# Patient Record
Sex: Male | Born: 1990 | Race: White | Hispanic: No | Marital: Single | Smoking: Never smoker
Health system: Southern US, Community
[De-identification: ages and names within clinical notes are randomized; demographics above are authoritative.]

## PROBLEM LIST (undated history)

## (undated) ENCOUNTER — Emergency Department (HOSPITAL_COMMUNITY): Admission: EM | Payer: BLUE CROSS/BLUE SHIELD

## (undated) DIAGNOSIS — K449 Diaphragmatic hernia without obstruction or gangrene: Secondary | ICD-10-CM

## (undated) DIAGNOSIS — Z87820 Personal history of traumatic brain injury: Secondary | ICD-10-CM

## (undated) DIAGNOSIS — F909 Attention-deficit hyperactivity disorder, unspecified type: Secondary | ICD-10-CM

## (undated) DIAGNOSIS — I517 Cardiomegaly: Secondary | ICD-10-CM

## (undated) DIAGNOSIS — K209 Esophagitis, unspecified without bleeding: Secondary | ICD-10-CM

## (undated) DIAGNOSIS — K219 Gastro-esophageal reflux disease without esophagitis: Secondary | ICD-10-CM

## (undated) DIAGNOSIS — G43909 Migraine, unspecified, not intractable, without status migrainosus: Secondary | ICD-10-CM

## (undated) HISTORY — DX: Cardiomegaly: I51.7

## (undated) HISTORY — PX: CHOLECYSTECTOMY, LAPAROSCOPIC: SHX56

## (undated) HISTORY — PX: WISDOM TOOTH EXTRACTION: SHX21

## (undated) HISTORY — PX: ESOPHAGOGASTRODUODENOSCOPY ENDOSCOPY: SHX5814

## (undated) HISTORY — PX: CHOLECYSTECTOMY: SHX55

---

## 2014-07-16 ENCOUNTER — Encounter (HOSPITAL_COMMUNITY): Payer: Self-pay | Admitting: Emergency Medicine

## 2014-07-16 ENCOUNTER — Emergency Department (HOSPITAL_COMMUNITY)
Admission: EM | Admit: 2014-07-16 | Discharge: 2014-07-17 | Disposition: A | Discharge status: Home / Self Care | Payer: BC Managed Care – PPO | Attending: Emergency Medicine | Admitting: Emergency Medicine

## 2014-07-16 ENCOUNTER — Emergency Department (HOSPITAL_COMMUNITY): Payer: BC Managed Care – PPO

## 2014-07-16 DIAGNOSIS — Z8709 Personal history of other diseases of the respiratory system: Secondary | ICD-10-CM | POA: Insufficient documentation

## 2014-07-16 DIAGNOSIS — R112 Nausea with vomiting, unspecified: Secondary | ICD-10-CM | POA: Insufficient documentation

## 2014-07-16 DIAGNOSIS — Z8659 Personal history of other mental and behavioral disorders: Secondary | ICD-10-CM | POA: Insufficient documentation

## 2014-07-16 DIAGNOSIS — R1084 Generalized abdominal pain: Secondary | ICD-10-CM | POA: Diagnosis not present

## 2014-07-16 DIAGNOSIS — K219 Gastro-esophageal reflux disease without esophagitis: Secondary | ICD-10-CM | POA: Diagnosis not present

## 2014-07-16 DIAGNOSIS — R05 Cough: Secondary | ICD-10-CM | POA: Diagnosis not present

## 2014-07-16 DIAGNOSIS — R6883 Chills (without fever): Secondary | ICD-10-CM | POA: Insufficient documentation

## 2014-07-16 DIAGNOSIS — R079 Chest pain, unspecified: Secondary | ICD-10-CM | POA: Diagnosis not present

## 2014-07-16 DIAGNOSIS — R1010 Upper abdominal pain, unspecified: Secondary | ICD-10-CM

## 2014-07-16 HISTORY — DX: Attention-deficit hyperactivity disorder, unspecified type: F90.9

## 2014-07-16 HISTORY — DX: Gastro-esophageal reflux disease without esophagitis: K21.9

## 2014-07-16 LAB — CBC WITH DIFFERENTIAL/PLATELET
Basophils Absolute: 0 10*3/uL (ref 0.0–0.1)
Basophils Relative: 0 % (ref 0–1)
Eosinophils Absolute: 0.1 10*3/uL (ref 0.0–0.7)
Eosinophils Relative: 1 % (ref 0–5)
HEMATOCRIT: 43.9 % (ref 39.0–52.0)
HEMOGLOBIN: 15.3 g/dL (ref 13.0–17.0)
LYMPHS ABS: 1.7 10*3/uL (ref 0.7–4.0)
LYMPHS PCT: 25 % (ref 12–46)
MCH: 30.2 pg (ref 26.0–34.0)
MCHC: 34.9 g/dL (ref 30.0–36.0)
MCV: 86.6 fL (ref 78.0–100.0)
MONO ABS: 0.4 10*3/uL (ref 0.1–1.0)
Monocytes Relative: 7 % (ref 3–12)
Neutro Abs: 4.6 10*3/uL (ref 1.7–7.7)
Neutrophils Relative %: 67 % (ref 43–77)
Platelets: 255 10*3/uL (ref 150–400)
RBC: 5.07 MIL/uL (ref 4.22–5.81)
RDW: 13 % (ref 11.5–15.5)
WBC: 6.8 10*3/uL (ref 4.0–10.5)

## 2014-07-16 LAB — COMPREHENSIVE METABOLIC PANEL
ALT: 80 U/L — ABNORMAL HIGH (ref 0–53)
ANION GAP: 15 (ref 5–15)
AST: 45 U/L — AB (ref 0–37)
Albumin: 4.6 g/dL (ref 3.5–5.2)
Alkaline Phosphatase: 61 U/L (ref 39–117)
BUN: 9 mg/dL (ref 6–23)
CO2: 23 meq/L (ref 19–32)
CREATININE: 0.81 mg/dL (ref 0.50–1.35)
Calcium: 9.9 mg/dL (ref 8.4–10.5)
Chloride: 104 mEq/L (ref 96–112)
GFR calc Af Amer: >90 mL/min (ref >90)
Glucose, Bld: 94 mg/dL (ref 70–99)
POTASSIUM: 4.1 meq/L (ref 3.7–5.3)
Sodium: 142 mEq/L (ref 137–147)
Total Bilirubin: 0.4 mg/dL (ref 0.3–1.2)
Total Protein: 8 g/dL (ref 6.0–8.3)

## 2014-07-16 LAB — LIPASE, BLOOD: LIPASE: 39 U/L (ref 11–59)

## 2014-07-16 LAB — I-STAT TROPONIN, ED: Troponin i, poc: 0 ng/mL (ref 0.00–0.08)

## 2014-07-16 MED ORDER — GI COCKTAIL ~~LOC~~
30.0000 mL | Freq: Once | ORAL | Status: AC
  Administered 2014-07-16: 30 mL via ORAL
  Filled 2014-07-16: qty 30

## 2014-07-16 MED ORDER — ONDANSETRON HCL 4 MG/2ML IJ SOLN
4.0000 mg | Freq: Once | INTRAMUSCULAR | Status: AC
  Administered 2014-07-17: 4 mg via INTRAVENOUS
  Filled 2014-07-16: qty 2

## 2014-07-16 MED ORDER — SUCRALFATE 1 G PO TABS
1.0000 g | ORAL_TABLET | Freq: Once | ORAL | Status: AC
  Administered 2014-07-16: 1 g via ORAL
  Filled 2014-07-16: qty 1

## 2014-07-16 MED ORDER — MORPHINE SULFATE 4 MG/ML IJ SOLN
4.0000 mg | Freq: Once | INTRAMUSCULAR | Status: AC
  Administered 2014-07-17: 4 mg via INTRAVENOUS
  Filled 2014-07-16: qty 1

## 2014-07-16 MED ORDER — ONDANSETRON 4 MG PO TBDP
8.0000 mg | ORAL_TABLET | Freq: Once | ORAL | Status: AC
  Administered 2014-07-16: 8 mg via ORAL
  Filled 2014-07-16: qty 2

## 2014-07-16 MED ORDER — PANTOPRAZOLE SODIUM 40 MG IV SOLR
40.0000 mg | Freq: Once | INTRAVENOUS | Status: AC
  Administered 2014-07-17: 40 mg via INTRAVENOUS
  Filled 2014-07-16: qty 40

## 2014-07-16 MED ORDER — OXYCODONE-ACETAMINOPHEN 5-325 MG PO TABS
1.0000 | ORAL_TABLET | Freq: Once | ORAL | Status: AC
  Administered 2014-07-16: 1 via ORAL
  Filled 2014-07-16: qty 1

## 2014-07-16 NOTE — ED Notes (Signed)
Pt belching,hicupping and spitting. Pt speaking with mother on phone. [facility]

## 2014-07-16 NOTE — ED Provider Notes (Signed)
CSN: [id number]     Arrival date & time 07/16/14  1732 History   First MD Initiated Contact with Patient 07/16/14 2055     Chief Complaint  Patient presents with  . Chest Pain  . Nausea     (Consider location/radiation/quality/duration/timing/severity/associated sxs/prior Treatment) HPI  23 year old male with history of acid reflux, ADHD who presents complaining of epigastric abdominal pain. Patient reports he was diagnosed with a hiatal hernia when he was 23 year old. He has had intermittent epigastric pain that is related to his acid reflux which he takes Prilosec.  2 weeks ago he was diagnosed with bronchitis and he has been having persistent cough since. Within the past week he has had increased epigastric abdominal pain which she described as a sharp and burning sensation sometimes lasting for seconds but sometimes lasting longer. Today the pain has been intense, causing him to double over. He also reported of bloody vomits whenever he eats anything.  Pt recall drinking some fluid earlier this morning and immediately vomit up.  Bleeding is trace of red streaks when he vomits. He has normal stool without any black tarry stool. He endorses chills but denies fever. Denies any headache, lightheadedness or dizziness. No hemoptysis. No prior history of PE or DVT. He did drank a small amount of alcohol 2 days ago which did aggravate his symptoms. He denies any NSAID use. [place] Patient states his doctor would like to schedule him for an endoscopy in the near future.  Past Medical History  Diagnosis Date  . Acid reflux   . ADHD (attention deficit hyperactivity disorder)    History reviewed. No pertinent past surgical history. History reviewed. No pertinent family history. History  Substance Use Topics  . Smoking status: Never Smoker   . Smokeless tobacco: Not on file  . Alcohol Use: Yes    Review of Systems  All other systems reviewed and are  negative.     Allergies  Ibuprofen  Home Medications   Prior to Admission medications   Not on File   BP 107/71  Pulse 92  Temp(Src) 98.1 F (36.7 C) (Oral)  Resp 16  SpO2 97% Physical Exam  Constitutional: He appears well-developed and well-nourished. No distress.  HENT:  Head: Atraumatic.  Eyes: Conjunctivae are normal.  Neck: Normal range of motion. Neck supple.  Cardiovascular: Normal rate, regular rhythm and intact distal pulses.   Pulmonary/Chest: Effort normal and breath sounds normal. He exhibits no tenderness.  Abdominal: Soft. Bowel sounds are normal. He exhibits no distension. There is tenderness (Diffuse abdominal tenderness most significant epigastric without overlying skin changes. No guarding or rebound tenderness.).  Neurological: He is alert.  Skin: No rash noted.  Psychiatric: He has a normal mood and affect.    ED Course  Procedures (including critical care time)  9:20 PM Patient with epigastric abdominal pain with history of acid reflux and hiatal hernia. Suspect pain likely related to PUD, or gastritis. Hematochezia likely from [facility] tear, doubt [place]. Xray ordered.  His labs are reassuring, he has a TIMI score of 0, and PERC negative. We'll give GI cocktail and Carafate.  11:50 PM No improvement of pain despite taking gi cocktail, carafate, and percocet.  Pt still nauseous, is tearful, and having pain to epigastric region and LUQ.  Will start iv medication and will also obtain us to r/o biliary disease.    3:05 AM Pt felt better after treatment.  Tolerates PO.  abd US with evidence  of hepatic steatosis but no acute finding.  Reassurance given.  Pt given resources, and GI referral as needed.  Pain medication and antinausea medication provided.  Return precaution discussed.  School note provided.    Labs Review Labs Reviewed  COMPREHENSIVE METABOLIC PANEL - Abnormal; Notable for the following:    AST 45 (*)    ALT 80 (*)    All other  components within normal limits  CBC WITH DIFFERENTIAL  LIPASE, BLOOD  I-STAT TROPOININ, ED    Imaging Review Dg Chest 2 View  07/16/2014   CLINICAL DATA:  Chest pain. Hemoptysis. Shortness of breath. Vomiting.  EXAM: CHEST  2 VIEW  COMPARISON:  None.  FINDINGS: The heart size and pulmonary vascularity are normal and the lungs are clear. No osseous abnormality. No effusions.  IMPRESSION: Normal exam.   Electronically Signed   By: Geanie CooleyJim  Maxwell M.D.   On: 07/16/2014 22:27   [place] Abdomen Complete  07/17/2014   CLINICAL DATA:  Upper abdominal pain and hematemesis for 2 days.  EXAM: ULTRASOUND ABDOMEN COMPLETE  COMPARISON:  None.  FINDINGS: Gallbladder: No gallstones or wall thickening visualized. No sonographic Murphy sign noted.  Common bile duct: Diameter: 5 mm  Liver: Echogenic 1 cm probable hemangioma. The liver is diffusely echogenic without intrahepatic biliary dilatation. Within normal limits in parenchymal echogenicity.  IVC: Obscured, likely by bowel gas.  Pancreas: Obscured, likely by bowel gas.  Spleen: Size and appearance within normal limits.  Right Kidney: Length: 11 cm. Echogenicity within normal limits. No mass or hydronephrosis visualized.  Left Kidney: Length: 10.6 cm. Echogenicity within normal limits. No mass or hydronephrosis visualized.  Abdominal aorta: No aneurysm visualized.  Other findings: None.  IMPRESSION: Hepatic steatosis.  No acute sonographic findings.   Electronically Signed   By: Awilda Metroourtnay  Bloomer   On: 07/17/2014 01:48     EKG Interpretation None      MDM   Final diagnoses:  Chest pain  Upper abdominal pain    BP 118/75  Pulse 68  Temp(Src) 98.1 F (36.7 C) (Oral)  Resp 20  SpO2 95%  I have reviewed nursing notes and vital signs. I personally reviewed the imaging tests through PACS system  I reviewed available ER/hospitalization records thought the EMR     Fayrene HelperBowie Carrisa Keller, [place] JerseyPA-C 07/17/14 [id number]

## 2014-07-16 NOTE — ED Notes (Signed)
C/o LUQ pain, also reports nvd, fever, blood in emesis, blood in stool. BM today normal. Last diarrhea episode was 3d ago. Last emesis 1 hr ago in w/r and PTA. Relates to GI hx. Recently worked up back home [facility]([place] for the same, "to be scheduled for endo".  Alert, NAD, calm.

## 2014-07-16 NOTE — ED Notes (Signed)
Per pt sts epigastric pain with nausea, vomiting. sts taking prilosec without relief. Was told previous he possibly had hiatal hernia.

## 2014-07-16 NOTE — ED Notes (Signed)
Alert, NAD, calm, interactive, "feels the same", scant emesis noted (clear), belching.

## 2014-07-16 NOTE — ED Notes (Signed)
MD at bedside. 

## 2014-07-17 ENCOUNTER — Emergency Department (HOSPITAL_COMMUNITY): Payer: BC Managed Care – PPO

## 2014-07-17 MED ORDER — HYDROMORPHONE HCL 1 MG/ML IJ SOLN
1.0000 mg | Freq: Once | INTRAMUSCULAR | Status: AC
  Administered 2014-07-17: 1 mg via INTRAVENOUS
  Filled 2014-07-17: qty 1

## 2014-07-17 MED ORDER — ONDANSETRON HCL 4 MG PO TABS: 4.0000 mg | ORAL_TABLET | Freq: Four times a day (QID) | ORAL | Status: AC

## 2014-07-17 MED ORDER — HYDROCODONE-ACETAMINOPHEN 5-325 MG PO TABS: 2.0000 | ORAL_TABLET | Freq: Four times a day (QID) | ORAL | Status: DC | PRN

## 2014-07-17 MED ORDER — METOCLOPRAMIDE HCL 5 MG/ML IJ SOLN
10.0000 mg | Freq: Once | INTRAMUSCULAR | Status: AC
  Administered 2014-07-17: 10 mg via INTRAVENOUS
  Filled 2014-07-17: qty 2

## 2014-07-17 NOTE — ED Notes (Signed)
Back from US, EDPA in to room to speak with pt.

## 2014-07-17 NOTE — ED Notes (Signed)
Pt not in room, pt in US.  

## 2014-07-17 NOTE — Discharge Instructions (Signed)
Gastritis, Adult °Gastritis is soreness and swelling (inflammation) of the lining of the stomach. Gastritis can develop as a sudden onset (acute) or long-term (chronic) condition. If gastritis is not treated, it can lead to stomach bleeding and ulcers. °CAUSES  °Gastritis occurs when the stomach lining is weak or damaged. Digestive juices from the stomach then inflame the weakened stomach lining. The stomach lining may be weak or damaged due to viral or bacterial infections. One common bacterial infection is the Helicobacter pylori infection. Gastritis can also result from excessive alcohol consumption, taking certain medicines, or having too much acid in the stomach.  °SYMPTOMS  °In some cases, there are no symptoms. When symptoms are present, they may include: °· Pain or a burning sensation in the upper abdomen. °· Nausea. °· Vomiting °· An uncomfortable feeling of fullness after eating. °DIAGNOSIS  °Your caregiver may suspect you have gastritis based on your symptoms and a physical exam. To determine the cause of your gastritis, your caregiver may perform the following: °· Blood or stool tests to check for the H pylori bacterium. °· Gastroscopy. A thin, flexible tube (endoscope) is passed down the esophagus and into the stomach. The endoscope has a light and camera on the end. Your caregiver uses the endoscope to view the inside of the stomach. °· Taking a tissue sample (biopsy) from the stomach to examine under a microscope. °TREATMENT  °Depending on the cause of your gastritis, medicines may be prescribed. If you have a bacterial infection, such as an H pylori infection, antibiotics may be given. If your gastritis is caused by too much acid in the stomach, H2 blockers or antacids may be given. Your caregiver may recommend that you stop taking aspirin, ibuprofen, or other nonsteroidal anti-inflammatory drugs (NSAIDs). °HOME CARE INSTRUCTIONS °· Only take over-the-counter or prescription medicines as directed by  your caregiver. °· If you were given antibiotic medicines, take them as directed. Finish them even if you start to feel better. °· Drink enough fluids to keep your urine clear or pale yellow. °· Avoid foods and drinks that make your symptoms worse, such as: °¨ Caffeine or alcoholic drinks. °¨ Chocolate. °¨ Peppermint or mint flavorings. °¨ Garlic and onions. °¨ Spicy foods. °¨ Citrus fruits, such as oranges, lemons, or limes. °¨ Tomato-based foods such as sauce, chili, salsa, and pizza. °¨ Fried and fatty foods. °· Eat small, frequent meals instead of large meals. °SEEK IMMEDIATE MEDICAL CARE IF:  °· You have black or dark red stools. °· You vomit blood or material that looks like coffee grounds. °· You are unable to keep fluids down. °· Your abdominal pain gets worse. °· You have a fever. °· You do not feel better after 1 week. °· You have any other questions or concerns. °MAKE SURE YOU: °· Understand these instructions. °· Will watch your condition. °· Will get help right away if you are not doing well or get worse. °Document Released: 08/28/2001 Document Revised: 03/04/2012 Document Reviewed: 10/17/2011 °ExitCare® Patient Information ©2015 [employer]. This information is not intended to replace advice given to you by your health care provider. Make sure you discuss any questions you have with your health care provider. ° °Emergency Department Resource Guide °1) Find a Doctor and Pay Out of Pocket °Although you won't have to find out who is covered by your insurance plan, it is a good idea to ask around and get recommendations. You will then need to call the office and see if the doctor you have chosen   will accept you as a new patient and what types of options they offer for patients who are self-pay. Some doctors offer discounts or will set up payment plans for their patients who do not have insurance, but you will need to ask so you aren't surprised when you get to your appointment. ° °2) Contact Your Local  Health Department °Not all health departments have doctors that can see patients for sick visits, but many do, so it is worth a call to see if yours does. If you don't know where your local health department is, you can check in your phone book. The CDC also has a tool to help you locate your state's health department, and many state websites also have listings of all of their local health departments. ° °3) Find a Walk-in Clinic °If your illness is not likely to be very severe or complicated, you may want to try a walk in clinic. These are popping up all over the country in pharmacies, drugstores, and shopping centers. They're usually staffed by nurse practitioners or physician assistants that have been trained to treat common illnesses and complaints. They're usually fairly quick and inexpensive. However, if you have serious medical issues or chronic medical problems, these are probably not your best option. ° °No Primary Care Doctor: °- Call Health Connect at  832-8000 - they can help you locate a primary care doctor that  accepts your insurance, provides certain services, etc. °- Physician Referral Service- 1-800-533-3463 ° °Chronic Pain Problems: °Organization         Address  Phone   Notes  °[facility]  (336) 297-2271 Patients need to be referred by their primary care doctor.  ° °Medication Assistance: °Organization         Address  Phone   Notes  °[place] [facility] [address] °[place], [place] [address] (336) 641-8030 --Must be a resident of [place] °-- Must have NO insurance coverage whatsoever (no Medicaid/ Medicare, etc.) °-- The pt. MUST have a primary care doctor that directs their care regularly and follows them in the community °  °[facility]  (866) 331-1348   °[employer]  (888) 892-1162   ° °Agencies that provide inexpensive medical care: °Organization         Address  Phone   Notes  °[facility]  (336) 832-8035    °[facility]    (336) 832-7272   °[facility] [address] °[place], [place] [address] (336) 832-4777   °[facility] [address], °[place] (336) 271-4999   °[facility]    (336) 373-0678   °[facility]    (336) 272-1050   °[facility] ° [address], [place] Phone:  (336) 832-4444, Fax:  (336) 832-4440 Hours of Operation:  9 am - 6 pm, M-F.  Also accepts Medicaid/Medicare and self-pay.  °[facility] ° [address], [place] Phone: (336) 832-3150, Fax: (336) [phone]. Hours of Operation:  8:30 am - 5:30 pm, M-F.  Also accepts Medicaid and self-pay.  °[facility] [place] [address], [place] Phone: (336) 878-6027   °[facility] [address], [place],  (336)723-1848, Ext. 123 Mondays & Thursdays: 7-9 AM.  First 15 patients are seen on a first come, first serve basis. °  ° °Medicaid-accepting [place] Providers: ° °Organization           Address  Phone   Notes  °[facility] [address], [place] (336) 641-2100 Also accepts self-pay patients.  °[facility] [address], [place] ° (336) 856-9996   °[facility] [address], [place] (336) 288-8857   °[facility] [address] [place] [address], [place] (336) 299-7000   °Veita Bland [address], [place]  ° (336) 373-1557 Only accepts [place] Access Medicaid patients after they have their name applied to their card.  ° °Self-Pay (no insurance) in [place]: ° °Organization         Address  Phone   Notes  °Sickle Cell Patients, [facility] [address], [place] (336) 832-1970   °[facility] [address], [place] (336) 832-4400   °[facility] ° [address],  [place] (336) 992-4800   °[facility]/Dr. Osei-Bonsu ° [address] [place] [address], [place] or [address], [place] (336) [phone] Phone number for both [place] and [place] locations is the same.  °[facility] [address], [place] (336) 299-0000   °[facility] [address] [place] [address], [place] or [address] (336) 852-7530 °(336) 878-2260   °[facility] [address], [place] (336) 350-1642, phone; (336) 294-5005, fax Sees patients 1st and 3rd Saturday of every month.  Must not qualify for public or private insurance (i.e. Medicaid, Medicare, [place] Health Choice, Veterans' Benefits) • Household income should be no more than 200% of the poverty level •The clinic cannot treat you if you are pregnant or think you are pregnant • Sexually transmitted diseases are not treated at the clinic.  ° ° °Dental Care: °Organization         Address  Phone  Notes  °[facility] [address], [place] (336) 641-6152 Accepts children up to age 21 who are enrolled in Medicaid or [place] Health Choice; pregnant women with a Medicaid card; and children who have applied for Medicaid or [place] Health Choice, but were declined, whose parents can pay a reduced fee at time of service.  °[facility] [place]  [address], [place] (336) 641-7733 Accepts children up to age 21 who are enrolled in Medicaid or [place] Health Choice; pregnant women with a Medicaid card; and children who have applied for Medicaid or [place] Health Choice, but were declined, whose parents can pay a reduced fee at time of service.  °[facility] ° [address], [place] (336) 641-4533 Patients are seen by appointment only. Walk-ins are not accepted. [facility] will see patients 18 years of age and older. °Monday - Tuesday (8am-5pm) °Most Wednesdays  (8:30-5pm) °$30 per visit, cash only  °[facility] ° [address], [place] (336) 641-4533 Patients are seen by appointment only. Walk-ins are not accepted. [facility] will see patients 18 years of age and older. °One Wednesday Evening (Monthly: Volunteer Based).  $30 per visit, cash only  °[facility]  (919) 537-3737 for adults; Children under age 4, call Graduate Pediatric Dentistry at (919) [phone]. Children aged 4-14, please call (919) [phone] to request a pediatric application. ° Dental services are provided in all areas of dental care including   fillings, crowns and bridges, complete and partial dentures, implants, gum treatment, root canals, and extractions. Preventive care is also provided. Treatment is provided to both adults and children. °Patients are selected via a lottery and there is often a waiting list. °  °[facility] [address], °[place] ° (336) 763-8833 [email or web address] °  °[facility] [address], [place], [place] (336)723-1848, Ext. 123 Second and Fourth Thursday of each month, opens at 6:30 AM; Clinic ends at 9 AM.  Patients are seen on a first-come first-served basis, and a limited number are seen during each clinic.  ° °[facility] ° [address], [place], [place] (336) 723-7904   Eligibility Requirements °You must have lived in [place], [place], or [place] counties for at least the last three months. °  You cannot be eligible for state or federal sponsored healthcare insurance, including Veterans Administration, Medicaid, or Medicare. °  You generally cannot be eligible for healthcare insurance through your employer.  °  How to apply: °Eligibility screenings are held every Tuesday and Wednesday afternoon from 1:00 pm until 4:00 pm. You do not need an appointment for the interview!  °[facility] [address], [place], [place] 336-631-2330   °[place]  [facility]  336-342-8273   °[facility]  336-703-3100   °[facility]  336-570-6415   ° °Behavioral Health Resources in the Community: °Intensive Outpatient Programs °Organization         Address  Phone  Notes  °[facility] [address], [place], [place] 336-878-6098   °[facility] [address], [place], [place] 336-832-9800   °[facility] [address], [place], [place] ° 336-882-2125   °[facility] [address],  °[place], [place] 1-800-853-5163 or 336-641-4981   °Substance Abuse Resources °Organization         Address  Phone  Notes  °[facility]  336-882-2125   °[facility]  336-784-9470   °[facility]  336-285-9073   °[facility]  336-845-3988   °Residential & Outpatient Substance Abuse Program  1-800-659-3381   °Psychological Services °Organization         Address  Phone  Notes  °[facility]  336- 832-9600   °[facility]  336- 378-7881   °[facility] [address], [place] 1-800-853-5163 or 336-641-4981   ° °Mobile Crisis Teams °Organization         Address  Phone  Notes  °[facility], Mobile Crisis Care Unit  1-877-626-1772   °Assertive °[facility] ° [address] [place], [place] 336-834-9664   °Sharon DeEsch [address] °[place] 336-554-5454   ° °Self-Help/Support Groups °Organization         Address  Phone             Notes  °[facility] [place] - variety of support groups  336- 373-1402 Call for more information  °Narcotics Anonymous (NA), [facility] [address], °[place]  2 meetings at this location  ° °Residential Treatment Programs °Organization         Address  Phone  Notes  °[facility] [address],    °[place]  1-866-801-8205   °[facility] ° [address], [place], [place]  704-293-8524   °[facility] [address],   [place] 336-845-3988 Admissions: 8am-3pm M-F  °[facility] [address]    °[place], [place] 336-841-1104   °[facility] [address], [place], [place] 336-379-7146   °[facility] [address]  °[place], [place] 336-285-9073   °[facility] Intensive Outpatient [address], [place], [place] 336-852-3033   °[facility] [address]  °[place], [place] 1-877-615-2722 or 336-784-9470   °[facility] [address] [place], [place] 336-227-7417 Accepts Medicaid  °[facility] [address]  °[place] 1-800-659-3381 Substance Abuse/Addiction Treatment  ° °[place] Behavioral Health Resources °Organization         Address  Phone  Notes  °[facility]  (888) 581-9988   °Julie Brannon, PhD [address] [place], [place]   (336) 349-5553 or (336) 951-0000   °[facility]   [address] °[place], [place] (336) 349-4454   °[facility] [address], [place], [place] (336) 342-8316 Insurance/Medicaid/sponsorship through [facility]  °[facility] [address]                                    [place], [place] (336) 342-8316 Therapy/tele-psych/case  °[facility] [address]  ° [place], [place] (336) 349-2233    °Dr. Arfeen  (336) 349-4544   °[facility] [place]  [employer] [place] [facility] 1) [address], [place] °2) [address], [place] °3)  [address], [place] (336) 349-3220 °(336) 342-7768 ° °(336) 342-8140   °[place] Child Abuse [facility] (336) 342-1394 or (336) 342-3537 (After Hours)    ° ° °

## 2014-07-17 NOTE — ED Provider Notes (Signed)
Medical screening examination/treatment/procedure(s) were performed by non-physician practitioner and as supervising physician I was immediately available for consultation/collaboration.    Dione Boozeavid Amaru Burroughs, MD 07/17/14 2300

## 2014-07-17 NOTE — ED Notes (Signed)
Returned from [facility]

## 2014-07-17 NOTE — ED Notes (Signed)
Pt speaking with mother via phone.

## 2014-07-17 NOTE — ED Notes (Signed)
remains alert, NAD, calmer, "feels better".

## 2014-07-20 ENCOUNTER — Encounter: Payer: Self-pay | Admitting: Internal Medicine

## 2014-07-21 ENCOUNTER — Other Ambulatory Visit: Payer: Self-pay | Admitting: Gastroenterology

## 2014-07-21 DIAGNOSIS — R112 Nausea with vomiting, unspecified: Secondary | ICD-10-CM

## 2014-07-21 DIAGNOSIS — R1084 Generalized abdominal pain: Secondary | ICD-10-CM

## 2014-07-23 ENCOUNTER — Ambulatory Visit: Payer: BC Managed Care – PPO | Admitting: Internal Medicine

## 2014-07-24 ENCOUNTER — Inpatient Hospital Stay (HOSPITAL_COMMUNITY)
Admission: EM | Admit: 2014-07-24 | Discharge: 2014-08-02 | DRG: 372 | Disposition: A | Discharge status: Other Acute Inpatient Hospital | Payer: BC Managed Care – PPO | Attending: Internal Medicine | Admitting: Internal Medicine

## 2014-07-24 ENCOUNTER — Encounter (HOSPITAL_COMMUNITY): Payer: Self-pay | Admitting: Oncology

## 2014-07-24 ENCOUNTER — Telehealth: Payer: Self-pay | Admitting: Internal Medicine

## 2014-07-24 DIAGNOSIS — R7401 Elevation of levels of liver transaminase levels: Secondary | ICD-10-CM

## 2014-07-24 DIAGNOSIS — Z6832 Body mass index (BMI) 32.0-32.9, adult: Secondary | ICD-10-CM

## 2014-07-24 DIAGNOSIS — E860 Dehydration: Secondary | ICD-10-CM | POA: Diagnosis present

## 2014-07-24 DIAGNOSIS — R079 Chest pain, unspecified: Secondary | ICD-10-CM | POA: Diagnosis present

## 2014-07-24 DIAGNOSIS — E669 Obesity, unspecified: Secondary | ICD-10-CM | POA: Diagnosis present

## 2014-07-24 DIAGNOSIS — R52 Pain, unspecified: Secondary | ICD-10-CM

## 2014-07-24 DIAGNOSIS — A047 Enterocolitis due to Clostridium difficile: Secondary | ICD-10-CM | POA: Diagnosis not present

## 2014-07-24 DIAGNOSIS — W06XXXA Fall from bed, initial encounter: Secondary | ICD-10-CM

## 2014-07-24 DIAGNOSIS — Z79891 Long term (current) use of opiate analgesic: Secondary | ICD-10-CM

## 2014-07-24 DIAGNOSIS — B3781 Candidal esophagitis: Secondary | ICD-10-CM | POA: Diagnosis present

## 2014-07-24 DIAGNOSIS — F1910 Other psychoactive substance abuse, uncomplicated: Secondary | ICD-10-CM | POA: Diagnosis present

## 2014-07-24 DIAGNOSIS — K449 Diaphragmatic hernia without obstruction or gangrene: Secondary | ICD-10-CM | POA: Diagnosis present

## 2014-07-24 DIAGNOSIS — Z888 Allergy status to other drugs, medicaments and biological substances status: Secondary | ICD-10-CM

## 2014-07-24 DIAGNOSIS — Z91013 Allergy to seafood: Secondary | ICD-10-CM

## 2014-07-24 DIAGNOSIS — R1011 Right upper quadrant pain: Secondary | ICD-10-CM

## 2014-07-24 DIAGNOSIS — R000 Tachycardia, unspecified: Secondary | ICD-10-CM | POA: Diagnosis present

## 2014-07-24 DIAGNOSIS — F1210 Cannabis abuse, uncomplicated: Secondary | ICD-10-CM | POA: Diagnosis present

## 2014-07-24 DIAGNOSIS — F419 Anxiety disorder, unspecified: Secondary | ICD-10-CM | POA: Diagnosis present

## 2014-07-24 DIAGNOSIS — Z79899 Other long term (current) drug therapy: Secondary | ICD-10-CM

## 2014-07-24 DIAGNOSIS — E876 Hypokalemia: Secondary | ICD-10-CM | POA: Diagnosis not present

## 2014-07-24 DIAGNOSIS — K2970 Gastritis, unspecified, without bleeding: Secondary | ICD-10-CM | POA: Diagnosis present

## 2014-07-24 DIAGNOSIS — R112 Nausea with vomiting, unspecified: Secondary | ICD-10-CM

## 2014-07-24 DIAGNOSIS — F1310 Sedative, hypnotic or anxiolytic abuse, uncomplicated: Secondary | ICD-10-CM | POA: Diagnosis present

## 2014-07-24 DIAGNOSIS — F909 Attention-deficit hyperactivity disorder, unspecified type: Secondary | ICD-10-CM | POA: Diagnosis present

## 2014-07-24 DIAGNOSIS — K210 Gastro-esophageal reflux disease with esophagitis: Secondary | ICD-10-CM | POA: Diagnosis present

## 2014-07-24 DIAGNOSIS — K760 Fatty (change of) liver, not elsewhere classified: Secondary | ICD-10-CM | POA: Diagnosis present

## 2014-07-24 DIAGNOSIS — F908 Attention-deficit hyperactivity disorder, other type: Secondary | ICD-10-CM

## 2014-07-24 DIAGNOSIS — R197 Diarrhea, unspecified: Secondary | ICD-10-CM | POA: Diagnosis present

## 2014-07-24 DIAGNOSIS — R109 Unspecified abdominal pain: Secondary | ICD-10-CM

## 2014-07-24 DIAGNOSIS — R740 Nonspecific elevation of levels of transaminase and lactic acid dehydrogenase [LDH]: Secondary | ICD-10-CM | POA: Diagnosis present

## 2014-07-24 DIAGNOSIS — R1110 Vomiting, unspecified: Secondary | ICD-10-CM

## 2014-07-24 DIAGNOSIS — K5900 Constipation, unspecified: Secondary | ICD-10-CM | POA: Diagnosis present

## 2014-07-24 DIAGNOSIS — R1013 Epigastric pain: Secondary | ICD-10-CM | POA: Diagnosis not present

## 2014-07-24 HISTORY — DX: Esophagitis, unspecified: K20.9

## 2014-07-24 HISTORY — DX: Diaphragmatic hernia without obstruction or gangrene: K44.9

## 2014-07-24 HISTORY — DX: Esophagitis, unspecified without bleeding: K20.90

## 2014-07-24 LAB — URINALYSIS, ROUTINE W REFLEX MICROSCOPIC
Bilirubin Urine: NEGATIVE
Glucose, UA: NEGATIVE mg/dL
HGB URINE DIPSTICK: NEGATIVE
Ketones, ur: NEGATIVE mg/dL
Leukocytes, UA: NEGATIVE
Nitrite: NEGATIVE
PH: 6 (ref 5.0–8.0)
Protein, ur: NEGATIVE mg/dL
SPECIFIC GRAVITY, URINE: 1.024 (ref 1.005–1.030)
UROBILINOGEN UA: 0.2 mg/dL (ref 0.0–1.0)

## 2014-07-24 LAB — CBC WITH DIFFERENTIAL/PLATELET
Basophils Absolute: 0 10*3/uL (ref 0.0–0.1)
Basophils Relative: 0 % (ref 0–1)
EOS ABS: 0.2 10*3/uL (ref 0.0–0.7)
Eosinophils Relative: 3 % (ref 0–5)
HCT: 45.5 % (ref 39.0–52.0)
Hemoglobin: 16.3 g/dL (ref 13.0–17.0)
Lymphocytes Relative: 33 % (ref 12–46)
Lymphs Abs: 2.2 10*3/uL (ref 0.7–4.0)
MCH: 31.2 pg (ref 26.0–34.0)
MCHC: 35.8 g/dL (ref 30.0–36.0)
MCV: 87 fL (ref 78.0–100.0)
MONO ABS: 0.8 10*3/uL (ref 0.1–1.0)
Monocytes Relative: 12 % (ref 3–12)
Neutro Abs: 3.6 10*3/uL (ref 1.7–7.7)
Neutrophils Relative %: 52 % (ref 43–77)
PLATELETS: 220 10*3/uL (ref 150–400)
RBC: 5.23 MIL/uL (ref 4.22–5.81)
RDW: 12.8 % (ref 11.5–15.5)
WBC: 6.8 10*3/uL (ref 4.0–10.5)

## 2014-07-24 LAB — COMPREHENSIVE METABOLIC PANEL
ALT: 188 U/L — AB (ref 0–53)
ANION GAP: 12 (ref 5–15)
AST: 76 U/L — ABNORMAL HIGH (ref 0–37)
Albumin: 4.5 g/dL (ref 3.5–5.2)
Alkaline Phosphatase: 61 U/L (ref 39–117)
BUN: 12 mg/dL (ref 6–23)
CALCIUM: 10.5 mg/dL (ref 8.4–10.5)
CO2: 27 meq/L (ref 19–32)
Chloride: 99 mEq/L (ref 96–112)
Creatinine, Ser: 0.87 mg/dL (ref 0.50–1.35)
Glucose, Bld: 101 mg/dL — ABNORMAL HIGH (ref 70–99)
Potassium: 4.5 mEq/L (ref 3.7–5.3)
SODIUM: 138 meq/L (ref 137–147)
TOTAL PROTEIN: 8.3 g/dL (ref 6.0–8.3)
Total Bilirubin: 0.3 mg/dL (ref 0.3–1.2)

## 2014-07-24 LAB — LIPASE, BLOOD: LIPASE: 31 U/L (ref 11–59)

## 2014-07-24 MED ORDER — GI COCKTAIL ~~LOC~~
30.0000 mL | Freq: Once | ORAL | Status: AC
  Administered 2014-07-24: 30 mL via ORAL
  Filled 2014-07-24: qty 30

## 2014-07-24 MED ORDER — SODIUM CHLORIDE 0.9 % IV BOLUS (SEPSIS)
1000.0000 mL | Freq: Once | INTRAVENOUS | Status: AC
  Administered 2014-07-24: 1000 mL via INTRAVENOUS

## 2014-07-24 MED ORDER — ONDANSETRON HCL 4 MG/2ML IJ SOLN
4.0000 mg | Freq: Once | INTRAMUSCULAR | Status: AC
  Administered 2014-07-24: 4 mg via INTRAVENOUS
  Filled 2014-07-24: qty 2

## 2014-07-24 MED ORDER — HYDROCODONE-ACETAMINOPHEN 5-325 MG PO TABS
1.0000 | ORAL_TABLET | Freq: Once | ORAL | Status: AC
  Administered 2014-07-24: 1 via ORAL
  Filled 2014-07-24: qty 1

## 2014-07-24 NOTE — ED Notes (Signed)
Pt had an endoscopy yesterday showing a hiatal hernia and gastritis.  Pt has a HIDA scan scheduled outpatient however when they called Dr. Loreta AveMann to report that pt has been vomiting despite taking phenergan.  Dr. Kenna GilbertMann's associate recommended pt come to ER for evaluation and possibly a HIDA scan tonight.

## 2014-07-24 NOTE — ED Provider Notes (Signed)
CSN: [id number]     Arrival date & time 07/24/14  1944 History   First MD Initiated Contact with Patient 07/24/14 2052     Chief Complaint  Patient presents with  . Abdominal Pain    nausea/emesis     (Consider location/radiation/quality/duration/timing/severity/associated sxs/prior Treatment) HPI Comments: Johnson Arizola is a 23 y.o. male with a PMHx of ADHD, GERD, hiatal hernia, and gastritis, who presents to the ED at his doctor's recommendation for ongoing n/v despite using phenergan suppositories. He is seen by Dr. Collene Mares, who performed an endoscopy on him yesterday and diagnosed him with a hiatal hernia and gastritis, and scheduled him for a HIDA scan on Monday. Today the pt's mother called the office because the pt is still having 2 episodes of emesis daily despite phenergan and GI cocktail as well as dexilant, but overall the frequency of emesis is improved from before when he was having 8 episodes daily. Emesis is nonbloody nonbilious with just stomach contents. He's also having ongoing unchanged abd pain, which he states is 10/10 sharp intermittent, located in the LUQ/epigastrum, radiating into the RUQ and R shoulder, worse with laying flat or eating, and improved mildly with GI cocktail, vicodin, and eating a bland diet. When he called the office, Dr. Hilarie Fredrickson recommended coming in to the ER for fluids and antiemetics, and to see if HIDA scan could be obtained tonight. Pt has ongoing symptoms of early satiety, belching, and heartburn. Denies fevers, chills, CP, SOB, cough, diarrhea, constipation, obstipation, melena, hematochezia, hematemesis, coffee-ground emesis, dysuria, hematuria, back/flank pain, myalgias, arthralgias, paresthesias, weakness, lightheadedness, syncope, dizziness, or rashes.   Patient is a 23 y.o. male presenting with vomiting. The history is provided by the patient. No language interpreter was used.  Emesis Severity:  Moderate Duration:  1 week Timing:   Intermittent Number of daily episodes:  2x/day now, down from 8x/day Quality:  Stomach contents Able to tolerate:  Liquids (occasionally) Onset of vomiting after eating: varies. Progression:  Improving Chronicity:  Recurrent Recent urination:  Normal Relieved by:  Antiemetics (phenergan suppositories, dexilant, and GI cocktail with mild relief) Worsened by:  Nothing tried Ineffective treatments:  Antiemetics (phenergan only with occasional mild relief) Associated symptoms: abdominal pain   Associated symptoms: no arthralgias, no chills, no cough, no diarrhea, no fever, no headaches, no myalgias, no sore throat and no URI   Abdominal pain:    Location:  LUQ and epigastric   Quality:  Cramping and sharp   Severity:  Severe   Onset quality:  Gradual   Duration:  1 week   Timing:  Intermittent   Progression:  Waxing and waning   Chronicity:  Recurrent Risk factors: no alcohol use, no prior abdominal surgery, no sick contacts, no suspect food intake and no travel to endemic areas     Past Medical History  Diagnosis Date  . Acid reflux   . ADHD (attention deficit hyperactivity disorder)   . Esophagitis   . Hiatal hernia    Past Surgical History  Procedure Laterality Date  . Esophagogastroduodenoscopy endoscopy     History reviewed. No pertinent family history. History  Substance Use Topics  . Smoking status: Never Smoker   . Smokeless tobacco: Not on file  . Alcohol Use: Yes    Review of Systems  Constitutional: Positive for appetite change (decreased due to early satiety). Negative for fever and chills.  HENT: Negative for congestion and sore throat.   Respiratory: Negative for cough, chest tightness and shortness of breath.  Cardiovascular: Negative for chest pain.  Gastrointestinal: Positive for nausea, vomiting and abdominal pain. Negative for diarrhea, constipation, blood in stool, abdominal distention and rectal pain.  Genitourinary: Negative for dysuria, urgency,  frequency, hematuria and flank pain.  Musculoskeletal: Negative for myalgias, back pain, arthralgias and neck pain.  Skin: Negative for rash.  Neurological: Negative for dizziness, syncope, weakness, light-headedness, numbness and headaches.  Hematological: Negative for adenopathy.   10 Systems reviewed and are negative for acute change except as noted in the HPI.    Allergies  Shellfish allergy and Ibuprofen  Home Medications   Prior to Admission medications   Medication Sig Start Date End Date Taking? Authorizing Provider  albuterol (PROVENTIL HFA;VENTOLIN HFA) 108 (90 BASE) MCG/ACT inhaler Inhale 2 puffs into the lungs every 6 (six) hours as needed for wheezing or shortness of breath.    Historical Provider, MD  amphetamine-dextroamphetamine (ADDERALL XR) 20 MG 24 hr capsule Take 20 mg by mouth daily.    Historical Provider, MD  amphetamine-dextroamphetamine (ADDERALL) 20 MG tablet Take 20 mg by mouth daily.    Historical Provider, MD  HYDROcodone-acetaminophen (NORCO/VICODIN) 5-325 MG per tablet Take 2 tablets by mouth every 6 (six) hours as needed for moderate pain. 07/17/14   Domenic Moras, PA-C  omeprazole (PRILOSEC) 20 MG capsule Take 20 mg by mouth daily.    Historical Provider, MD  ondansetron (ZOFRAN) 4 MG tablet Take 1 tablet (4 mg total) by mouth every 6 (six) hours. 07/17/14   Domenic Moras, PA-C  sertraline (ZOLOFT) 50 MG tablet Take 25 mg by mouth daily.    Historical Provider, MD   BP 148/84 mmHg  Pulse 134  Temp(Src) 98.6 F (37 C) (Oral)  Resp 18  SpO2 99% Physical Exam  Constitutional: He is oriented to person, place, and time. He appears well-developed and well-nourished.  Non-toxic appearance. He appears distressed (uncomfortable).  Tachycardic initially which resolved. Afebrile, nontoxic, appears uncomfortable  HENT:  Head: Normocephalic and atraumatic.  Mouth/Throat: Oropharynx is clear and moist. Mucous membranes are dry.  Dry mucous membranes  Eyes:  Conjunctivae and EOM are normal. Right eye exhibits no discharge. Left eye exhibits no discharge.  Neck: Normal range of motion. Neck supple.  Cardiovascular: Regular rhythm, normal heart sounds and intact distal pulses.  Tachycardia present.  Exam reveals no gallop and no friction rub.   No murmur heard. Tachycardic initially which resolved  Pulmonary/Chest: Effort normal and breath sounds normal. No respiratory distress. He has no decreased breath sounds. He has no wheezes. He has no rhonchi. He has no rales.  Abdominal: Soft. Normal appearance and bowel sounds are normal. He exhibits no distension. There is tenderness in the right upper quadrant, epigastric area and left upper quadrant. There is guarding (active) and positive Murphy's sign. There is no rigidity, no rebound, no CVA tenderness and no tenderness at McBurney's point.    Soft, obese but nondistended, +BS throughout, with RUQ, epigastric, and LUQ TTP, mild active guarding but no rebound or rigidity, +murphy's, neg mcburney's, no CVA TTP  Musculoskeletal: Normal range of motion.  Neurological: He is alert and oriented to person, place, and time. He has normal strength. No sensory deficit.  Skin: Skin is warm, dry and intact. No rash noted.  Psychiatric: He has a normal mood and affect.  Nursing note and vitals reviewed.   ED Course  Procedures (including critical care time) Labs Review Labs Reviewed  COMPREHENSIVE METABOLIC PANEL - Abnormal; Notable for the following:    Glucose, Bld 101 (*)  AST 76 (*)    ALT 188 (*)    All other components within normal limits  CBC WITH DIFFERENTIAL  LIPASE, BLOOD  URINALYSIS, ROUTINE W REFLEX MICROSCOPIC    Imaging Review No results found.   Dg Chest 2 View  07/16/2014   CLINICAL DATA:  Chest pain. Hemoptysis. Shortness of breath. Vomiting.  EXAM: CHEST  2 VIEW  COMPARISON:  None.  FINDINGS: The heart size and pulmonary vascularity are normal and the lungs are clear. No osseous  abnormality. No effusions.  IMPRESSION: Normal exam.   Electronically Signed   By: Rozetta Nunnery M.D.   On: 07/16/2014 22:27   US Abdomen Complete  07/17/2014   CLINICAL DATA:  Upper abdominal pain and hematemesis for 2 days.  EXAM: ULTRASOUND ABDOMEN COMPLETE  COMPARISON:  None.  FINDINGS: Gallbladder: No gallstones or wall thickening visualized. No sonographic Murphy sign noted.  Common bile duct: Diameter: 5 mm  Liver: Echogenic 1 cm probable hemangioma. The liver is diffusely echogenic without intrahepatic biliary dilatation. Within normal limits in parenchymal echogenicity.  IVC: Obscured, likely by bowel gas.  Pancreas: Obscured, likely by bowel gas.  Spleen: Size and appearance within normal limits.  Right Kidney: Length: 11 cm. Echogenicity within normal limits. No mass or hydronephrosis visualized.  Left Kidney: Length: 10.6 cm. Echogenicity within normal limits. No mass or hydronephrosis visualized.  Abdominal aorta: No aneurysm visualized.  Other findings: None.  IMPRESSION: Hepatic steatosis.  No acute sonographic findings.   Electronically Signed   By: Elon Alas   On: 07/17/2014 01:48      EKG Interpretation None    EKG: sinus tachycardia  MDM   Final diagnoses:  Intractable vomiting with nausea, vomiting of unspecified type  Intractable abdominal pain  Transaminitis    23y/o male with abd pain, recently dx'd with hiatal hernia and gastritis, tolerating some fluids but still having vomiting with phenergan. Afebrile, appears dehydrated, some distress but abd exam nonperitoneal. Dr. Hilarie Fredrickson recommended pt come for fluids and antiemetics. Pt declines pain meds now, states his vicodin taken before coming is helping. Want to have HIDA scan done tonight. Discussed that we'll obtain labs to eval for changes, and I would see if HIDA could be done. Plan to rehydrate and attempt to control nausea, if not pt would need admission.  11:17 PM EKG from triage showing sinus tachy, which  has since resolved. CBC w/diff WNL. CMP with slightly worsened AST/ALT from last week (71/188 respectively) likely somewhat related to dehydration. Alk phos and bili WNL. Lipase WNL. Pain returning after several hours passing since taking his last vicodin, therefore will give vicodin and GI cocktail which he tolerated, and is starting to kick in. VS improved after fluid bolus, now has MMM. Nausea improved, will PO challenge. If pt fails PO challenge, will need admission. HIDA unable to be obtained tonight. U/A pending.  11:57 PM U/A WNL. Pt not tolerating PO challenge and now pain is worse after trying to drink fluids. Will admit now  12:33 AM Dr. Blaine Hamper returning page. Would like for me to contact GI for them to see pt in the morning. Would like HIDA to be ordered for the morning. Will admit to med-surg. Pt stable at this time.  12:45 AM Dr. Hilarie Fredrickson returning page, will come see the pt tomorrow. HIDA scheduled for 8:30AM tomorrow  BP 114/70 mmHg  Pulse 81  Temp(Src) 97.9 F (36.6 C) (Oral)  Resp 20  SpO2 97%  Meds ordered this encounter  Medications  .  sodium chloride 0.9 % bolus 1,000 mL    Sig:   . ondansetron (ZOFRAN) injection 4 mg    Sig:   . Alum & Mag Hydroxide-Simeth (GI COCKTAIL) SUSP suspension    Sig: Take 30 mLs by mouth 2 (two) times daily as needed for indigestion (nausea). Shake well.  Marland Kitchen gi cocktail (Maalox,Lidocaine,Donnatal)    Sig:   . HYDROcodone-acetaminophen (NORCO/VICODIN) 5-325 MG per tablet 1 tablet    Sig:      Patty Sermons Camprubi-Soms, PA-C 07/25/14 0046  Jasper Riling. Alvino Chapel, MD 07/26/14 2263

## 2014-07-24 NOTE — Telephone Encounter (Signed)
Called by patient's mother Nausea, vomiting, inability to keep food/fluids down due to vomiting Being worked up by Dr. Loreta AveMann, recent EGD, reported Hiatal hernia, mild gastritis, no source for symptoms HIDA pending for Monday Mother doesn't think he can wait.  Promethazine PR not helping.  Has been on Dexilant I advised they seek care in the ED for IV fluids and anti-emetics [facility]

## 2014-07-25 ENCOUNTER — Observation Stay (HOSPITAL_COMMUNITY): Payer: BC Managed Care – PPO

## 2014-07-25 DIAGNOSIS — K5900 Constipation, unspecified: Secondary | ICD-10-CM | POA: Diagnosis present

## 2014-07-25 DIAGNOSIS — E860 Dehydration: Secondary | ICD-10-CM | POA: Diagnosis present

## 2014-07-25 DIAGNOSIS — R1013 Epigastric pain: Secondary | ICD-10-CM | POA: Diagnosis present

## 2014-07-25 DIAGNOSIS — F1310 Sedative, hypnotic or anxiolytic abuse, uncomplicated: Secondary | ICD-10-CM | POA: Diagnosis present

## 2014-07-25 DIAGNOSIS — Z6832 Body mass index (BMI) 32.0-32.9, adult: Secondary | ICD-10-CM | POA: Diagnosis not present

## 2014-07-25 DIAGNOSIS — R740 Nonspecific elevation of levels of transaminase and lactic acid dehydrogenase [LDH]: Secondary | ICD-10-CM

## 2014-07-25 DIAGNOSIS — F909 Attention-deficit hyperactivity disorder, unspecified type: Secondary | ICD-10-CM | POA: Diagnosis present

## 2014-07-25 DIAGNOSIS — K2970 Gastritis, unspecified, without bleeding: Secondary | ICD-10-CM | POA: Diagnosis present

## 2014-07-25 DIAGNOSIS — E876 Hypokalemia: Secondary | ICD-10-CM | POA: Diagnosis not present

## 2014-07-25 DIAGNOSIS — R109 Unspecified abdominal pain: Secondary | ICD-10-CM | POA: Insufficient documentation

## 2014-07-25 DIAGNOSIS — E669 Obesity, unspecified: Secondary | ICD-10-CM | POA: Diagnosis present

## 2014-07-25 DIAGNOSIS — B3781 Candidal esophagitis: Secondary | ICD-10-CM | POA: Diagnosis present

## 2014-07-25 DIAGNOSIS — F1910 Other psychoactive substance abuse, uncomplicated: Secondary | ICD-10-CM | POA: Diagnosis present

## 2014-07-25 DIAGNOSIS — Z79891 Long term (current) use of opiate analgesic: Secondary | ICD-10-CM | POA: Diagnosis not present

## 2014-07-25 DIAGNOSIS — K449 Diaphragmatic hernia without obstruction or gangrene: Secondary | ICD-10-CM

## 2014-07-25 DIAGNOSIS — K760 Fatty (change of) liver, not elsewhere classified: Secondary | ICD-10-CM | POA: Diagnosis present

## 2014-07-25 DIAGNOSIS — R197 Diarrhea, unspecified: Secondary | ICD-10-CM | POA: Diagnosis present

## 2014-07-25 DIAGNOSIS — Z888 Allergy status to other drugs, medicaments and biological substances status: Secondary | ICD-10-CM | POA: Diagnosis not present

## 2014-07-25 DIAGNOSIS — R1110 Vomiting, unspecified: Secondary | ICD-10-CM

## 2014-07-25 DIAGNOSIS — R079 Chest pain, unspecified: Secondary | ICD-10-CM | POA: Diagnosis present

## 2014-07-25 DIAGNOSIS — F1210 Cannabis abuse, uncomplicated: Secondary | ICD-10-CM | POA: Diagnosis present

## 2014-07-25 DIAGNOSIS — R7401 Elevation of levels of liver transaminase levels: Secondary | ICD-10-CM

## 2014-07-25 DIAGNOSIS — Z91013 Allergy to seafood: Secondary | ICD-10-CM | POA: Diagnosis not present

## 2014-07-25 DIAGNOSIS — R112 Nausea with vomiting, unspecified: Secondary | ICD-10-CM | POA: Diagnosis present

## 2014-07-25 DIAGNOSIS — A047 Enterocolitis due to Clostridium difficile: Secondary | ICD-10-CM | POA: Diagnosis present

## 2014-07-25 DIAGNOSIS — R1011 Right upper quadrant pain: Secondary | ICD-10-CM

## 2014-07-25 DIAGNOSIS — Z79899 Other long term (current) drug therapy: Secondary | ICD-10-CM | POA: Diagnosis not present

## 2014-07-25 DIAGNOSIS — F908 Attention-deficit hyperactivity disorder, other type: Secondary | ICD-10-CM

## 2014-07-25 DIAGNOSIS — R000 Tachycardia, unspecified: Secondary | ICD-10-CM | POA: Diagnosis present

## 2014-07-25 DIAGNOSIS — F419 Anxiety disorder, unspecified: Secondary | ICD-10-CM | POA: Diagnosis present

## 2014-07-25 DIAGNOSIS — K210 Gastro-esophageal reflux disease with esophagitis: Secondary | ICD-10-CM | POA: Diagnosis present

## 2014-07-25 LAB — COMPREHENSIVE METABOLIC PANEL
ALT: 170 U/L — ABNORMAL HIGH (ref 0–53)
AST: 75 U/L — AB (ref 0–37)
Albumin: 4.1 g/dL (ref 3.5–5.2)
Alkaline Phosphatase: 50 U/L (ref 39–117)
Anion gap: 13 (ref 5–15)
BUN: 12 mg/dL (ref 6–23)
CALCIUM: 9.5 mg/dL (ref 8.4–10.5)
CO2: 26 meq/L (ref 19–32)
CREATININE: 0.8 mg/dL (ref 0.50–1.35)
Chloride: 99 mEq/L (ref 96–112)
GFR calc Af Amer: >90 mL/min (ref >90)
Glucose, Bld: 89 mg/dL (ref 70–99)
Potassium: 4.2 mEq/L (ref 3.7–5.3)
SODIUM: 138 meq/L (ref 137–147)
TOTAL PROTEIN: 7.2 g/dL (ref 6.0–8.3)
Total Bilirubin: 0.4 mg/dL (ref 0.3–1.2)

## 2014-07-25 LAB — CBC
HCT: 42.6 % (ref 39.0–52.0)
HEMOGLOBIN: 14.6 g/dL (ref 13.0–17.0)
MCH: 29.9 pg (ref 26.0–34.0)
MCHC: 34.3 g/dL (ref 30.0–36.0)
MCV: 87.1 fL (ref 78.0–100.0)
Platelets: 216 10*3/uL (ref 150–400)
RBC: 4.89 MIL/uL (ref 4.22–5.81)
RDW: 12.9 % (ref 11.5–15.5)
WBC: 7.7 10*3/uL (ref 4.0–10.5)

## 2014-07-25 LAB — PROTIME-INR
INR: 1.05 (ref 0.00–1.49)
Prothrombin Time: 13.8 seconds (ref 11.6–15.2)

## 2014-07-25 LAB — APTT: aPTT: 35 seconds (ref 24–37)

## 2014-07-25 MED ORDER — OXYCODONE HCL 5 MG PO TABS
10.0000 mg | ORAL_TABLET | ORAL | Status: DC | PRN
  Administered 2014-07-25 (×3): 10 mg via ORAL
  Filled 2014-07-25 (×3): qty 2

## 2014-07-25 MED ORDER — HYDROMORPHONE HCL 2 MG/ML IJ SOLN
2.0000 mg | Freq: Once | INTRAMUSCULAR | Status: AC
  Administered 2014-07-25: 2 mg via INTRAVENOUS
  Filled 2014-07-25: qty 1

## 2014-07-25 MED ORDER — HYDROMORPHONE HCL 1 MG/ML IJ SOLN
1.0000 mg | INTRAMUSCULAR | Status: DC | PRN
  Administered 2014-07-25 (×3): 1 mg via INTRAVENOUS
  Filled 2014-07-25 (×3): qty 1

## 2014-07-25 MED ORDER — DIPHENHYDRAMINE HCL 12.5 MG/5ML PO ELIX: 12.5000 mg | ORAL_SOLUTION | Freq: Four times a day (QID) | ORAL | Status: DC | PRN

## 2014-07-25 MED ORDER — OXYCODONE HCL 5 MG PO TABS
15.0000 mg | ORAL_TABLET | ORAL | Status: DC | PRN
  Administered 2014-07-25 – 2014-07-26 (×2): 15 mg via ORAL
  Filled 2014-07-25 (×2): qty 3

## 2014-07-25 MED ORDER — SODIUM CHLORIDE 0.9 % IJ SOLN: 9.0000 mL | INTRAMUSCULAR | Status: DC | PRN

## 2014-07-25 MED ORDER — PANTOPRAZOLE SODIUM 40 MG IV SOLR
40.0000 mg | Freq: Two times a day (BID) | INTRAVENOUS | Status: DC
  Administered 2014-07-25 – 2014-07-31 (×14): 40 mg via INTRAVENOUS
  Filled 2014-07-25 (×15): qty 40

## 2014-07-25 MED ORDER — NALOXONE HCL 0.4 MG/ML IJ SOLN: 0.4000 mg | INTRAMUSCULAR | Status: DC | PRN

## 2014-07-25 MED ORDER — DIPHENHYDRAMINE HCL 50 MG/ML IJ SOLN: 12.5000 mg | Freq: Four times a day (QID) | INTRAMUSCULAR | Status: DC | PRN

## 2014-07-25 MED ORDER — SODIUM CHLORIDE 0.9 % IV SOLN
INTRAVENOUS | Status: DC
Start: 2014-07-25 — End: 2014-07-30
  Administered 2014-07-25: 75 mL/h via INTRAVENOUS
  Administered 2014-07-26 – 2014-07-27 (×2): via INTRAVENOUS
  Administered 2014-07-28: 75 mL via INTRAVENOUS
  Administered 2014-07-28 – 2014-07-30 (×2): via INTRAVENOUS

## 2014-07-25 MED ORDER — HYDROMORPHONE 0.3 MG/ML IV SOLN: INTRAVENOUS | Status: DC

## 2014-07-25 MED ORDER — DOCUSATE SODIUM 100 MG PO CAPS
100.0000 mg | ORAL_CAPSULE | Freq: Two times a day (BID) | ORAL | Status: DC
  Administered 2014-07-25 – 2014-07-31 (×10): 100 mg via ORAL
  Filled 2014-07-25 (×16): qty 1

## 2014-07-25 MED ORDER — ONDANSETRON HCL 4 MG/2ML IJ SOLN: 4.0000 mg | Freq: Four times a day (QID) | INTRAMUSCULAR | Status: DC | PRN

## 2014-07-25 MED ORDER — SODIUM CHLORIDE 0.9 % IV SOLN
INTRAVENOUS | Status: DC
  Administered 2014-07-25 (×2): 125 mL/h via INTRAVENOUS

## 2014-07-25 MED ORDER — ALBUTEROL SULFATE (2.5 MG/3ML) 0.083% IN NEBU
2.5000 mg | INHALATION_SOLUTION | Freq: Four times a day (QID) | RESPIRATORY_TRACT | Status: DC | PRN
  Administered 2014-08-01 – 2014-08-02 (×2): 2.5 mg via RESPIRATORY_TRACT
  Filled 2014-07-25 (×2): qty 3

## 2014-07-25 MED ORDER — HEPARIN SODIUM (PORCINE) 5000 UNIT/ML IJ SOLN
5000.0000 [IU] | Freq: Three times a day (TID) | INTRAMUSCULAR | Status: DC
  Administered 2014-07-25 – 2014-08-02 (×26): 5000 [IU] via SUBCUTANEOUS
  Filled 2014-07-25 (×28): qty 1

## 2014-07-25 MED ORDER — SERTRALINE HCL 25 MG PO TABS
25.0000 mg | ORAL_TABLET | Freq: Every day | ORAL | Status: DC
  Administered 2014-07-25 – 2014-08-02 (×8): 25 mg via ORAL
  Filled 2014-07-25 (×9): qty 1

## 2014-07-25 MED ORDER — HYDROMORPHONE HCL 2 MG/ML IJ SOLN
2.0000 mg | INTRAMUSCULAR | Status: DC | PRN
  Administered 2014-07-25 – 2014-07-26 (×3): 2 mg via INTRAVENOUS
  Administered 2014-07-26: 1 mg via INTRAVENOUS
  Administered 2014-07-26 (×2): 2 mg via INTRAVENOUS
  Filled 2014-07-25 (×6): qty 1

## 2014-07-25 MED ORDER — POLYETHYLENE GLYCOL 3350 17 G PO PACK
17.0000 g | PACK | Freq: Every day | ORAL | Status: DC
  Administered 2014-07-28 – 2014-08-02 (×5): 17 g via ORAL
  Filled 2014-07-25 (×9): qty 1

## 2014-07-25 MED ORDER — ONDANSETRON HCL 4 MG/2ML IJ SOLN
4.0000 mg | Freq: Three times a day (TID) | INTRAMUSCULAR | Status: DC | PRN
  Administered 2014-07-25: 4 mg via INTRAVENOUS
  Filled 2014-07-25: qty 2

## 2014-07-25 MED ORDER — SUCRALFATE 1 GM/10ML PO SUSP
1.0000 g | Freq: Three times a day (TID) | ORAL | Status: DC
Start: 2014-07-25 — End: 2014-08-03
  Administered 2014-07-25 – 2014-08-02 (×27): 1 g via ORAL
  Filled 2014-07-25 (×36): qty 10

## 2014-07-25 MED ORDER — ONDANSETRON HCL 4 MG/2ML IJ SOLN
4.0000 mg | Freq: Four times a day (QID) | INTRAMUSCULAR | Status: DC | PRN
  Administered 2014-07-25 – 2014-08-02 (×15): 4 mg via INTRAVENOUS
  Filled 2014-07-25 (×16): qty 2

## 2014-07-25 MED ORDER — TECHNETIUM TC 99M MEBROFENIN IV KIT
5.0000 | PACK | Freq: Once | INTRAVENOUS | Status: AC | PRN
  Administered 2014-07-25: 5 via INTRAVENOUS

## 2014-07-25 MED ORDER — GI COCKTAIL ~~LOC~~
30.0000 mL | Freq: Three times a day (TID) | ORAL | Status: DC | PRN
  Administered 2014-07-25 – 2014-07-26 (×3): 30 mL via ORAL
  Filled 2014-07-25 (×5): qty 30

## 2014-07-25 MED ORDER — AMPHETAMINE-DEXTROAMPHET ER 10 MG PO CP24
20.0000 mg | ORAL_CAPSULE | Freq: Every day | ORAL | Status: DC
  Administered 2014-07-28 – 2014-07-30 (×3): 20 mg via ORAL
  Filled 2014-07-25 (×5): qty 2

## 2014-07-25 NOTE — H&P (Signed)
[facility] History and Physical  Yair Dusza ZOX:[id number] DOB: [birth year] DOA: 07/24/2014  Referring physician: ED physician PCP: No PCP Per Patient  Specialists:   Chief Complaint: Nausea, vomiting, abdominal pain, diarrhea  HPI: Tyrone Dixon is a 23 y.o. male with a PMHx of ADHD, GERD, hiatal hernia, and gastritis, who presents with Nausea, vomiting, abdominal pain, diarrhea.   Patient reports that he started having nausea and vomiting 3 weeks ago, which has been worsening during last week. He reports that he has an intractable nausea and vomiting. He vomited streaks of blood. He also has a mild diarrhea, with 1-3 bowel movements with loose stools each day. He states that he could have streaks of blood in his stools.  He also has abd pain, which is 10/10 in severity, sharp, intermittent, located in the LUQ/epigastrum, radiating into the RUQ and R shoulder. It is aggravated by laying flat or eating, and improved mildly with GI cocktail, vicodin. He does not have fever, but with chills. He is seen by Dr. Loreta Ave, who performed an endoscopy, which showed hiatal hernia and gastritis. He was scheduled for HIDA scan on Monday. Today the pt's mother called the office because the pt is still having 2 episodes of emesis daily despite phenergan and GI cocktail as well as dexilant. Dr. Rhea Belton recommended coming in to the ER for fluids and antiemetics, and to see if HIDA scan could be obtained tonight. Patient does not have chest pain, cough, shortness of breath, leg edema, symptoms of UTI, hematuria. Of note, he had US-abd on 10/30 which showed hepatic steatosis.   Work up in the ED demonstrates elevated ALP 61, AST 76, ALT 188, normal bilirubin; No leukocytosis; Lipase 31; Urinalysis negative. He is admitted to inpatient for further evaluation and treatment.  Review of Systems: As presented in the history of presenting illness, rest negative.  Where does patient live?  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 Yes  Allergy:  Allergies  Allergen Reactions  . Shellfish Allergy Anaphylaxis  . Ibuprofen Rash    Past Medical History  Diagnosis Date  . Acid reflux   . ADHD (attention deficit hyperactivity disorder)   . Esophagitis   . Hiatal hernia     Past Surgical History  Procedure Laterality Date  . Esophagogastroduodenoscopy endoscopy      Social History:  reports that he has never smoked. He does not have any smokeless tobacco history on file. He reports that he drinks alcohol. His drug history is not on file.  Family History: History reviewed. No pertinent family history.   Prior to Admission medications   Medication Sig Start Date End Date Taking? Authorizing Provider  albuterol (PROVENTIL HFA;VENTOLIN HFA) 108 (90 BASE) MCG/ACT inhaler Inhale 2 puffs into the lungs every 6 (six) hours as needed for wheezing or shortness of breath.   Yes Historical Provider, MD  Alum & Mag Hydroxide-Simeth (GI COCKTAIL) SUSP suspension Take 30 mLs by mouth 2 (two) times daily as needed for indigestion (nausea). Shake well.   Yes Historical Provider, MD  amphetamine-dextroamphetamine (ADDERALL XR) 20 MG 24 hr capsule Take 20 mg by mouth daily.   Yes Historical Provider, MD  amphetamine-dextroamphetamine (ADDERALL) 20 MG tablet Take 20 mg by mouth daily.   Yes Historical Provider, MD  Dexlansoprazole 30 MG capsule Take 30 mg by mouth daily.   Yes Historical Provider, MD  HYDROcodone-acetaminophen (NORCO/VICODIN) 5-325 MG per tablet Take 2 tablets by  mouth every 6 (six) hours as needed for moderate pain. 07/17/14  Yes Fayrene HelperBowie Tran, PA-C  omeprazole (PRILOSEC) 20 MG capsule Take 20 mg by mouth daily.   Yes Historical Provider, MD  ondansetron (ZOFRAN) 4 MG tablet Take 1 tablet (4 mg total) by mouth every 6 (six) hours. 07/17/14  Yes Fayrene HelperBowie Tran, PA-C  pantoprazole (PROTONIX) 40 MG tablet Take 40 mg by mouth daily.    Yes Historical Provider, MD  promethazine (PHENERGAN) 12.5 MG suppository Place 12.5 mg rectally every 6 (six) hours as needed for nausea or vomiting (nausea).   Yes Historical Provider, MD  sertraline (ZOLOFT) 50 MG tablet Take 25 mg by mouth daily.   Yes Historical Provider, MD    Physical Exam: Filed Vitals:   07/24/14 2227 07/24/14 2230 07/24/14 2300 07/25/14 0118  BP: 114/70   125/73  Pulse: 81 83 79 79  Temp: 97.9 F (36.6 C)   98.8 F (37.1 C)  TempSrc: Oral   Oral  Resp: 20 27 15 20   SpO2: 97% 99% 100% 98%   General: Not in acute distress HEENT:       Eyes: PERRL, EOMI, no scleral icterus       ENT: No discharge from the ears and nose, no pharynx injection, no tonsillar enlargement.        Neck: No JVD, no bruit, no mass felt. Cardiac: S1/S2, RRR, No murmurs, No gallops or rubs Pulm: Good air movement bilaterally. Clear to auscultation bilaterally. No rales, wheezing, rhonchi or rubs. WGN:FAOZAbd:Soft, obese but nondistended,  with RUQ, epigastric, and LUQ TTP, No guarding and rebound.  Positive Murphy's and Neg mcburney's, +BS  Ext: No edema bilaterally. 2+DP/PT pulse bilaterally Musculoskeletal: No joint deformities, erythema, or stiffness, ROM full Skin: No rashes.  Neuro: Alert and oriented X3, cranial nerves II-XII grossly intact, muscle strength 5/5 in all extremeties, sensation to light touch intact.  Psych: Patient is not psychotic, no suicidal or hemocidal ideation.  Labs on Admission:  Basic Metabolic Panel:  Recent Labs Lab 07/24/14 2048  NA 138  K 4.5  CL 99  CO2 27  GLUCOSE 101*  BUN 12  CREATININE 0.87  CALCIUM 10.5   Liver Function Tests:  Recent Labs Lab 07/24/14 2048  AST 76*  ALT 188*  ALKPHOS 61  BILITOT 0.3  PROT 8.3  ALBUMIN 4.5    Recent Labs Lab 07/24/14 2048  LIPASE 31   No results for input(s): AMMONIA in the last 168 hours. CBC:  Recent Labs Lab 07/24/14 2048  WBC 6.8  NEUTROABS 3.6  HGB 16.3  HCT 45.5  MCV 87.0  PLT  220   Cardiac Enzymes: No results for input(s): CKTOTAL, CKMB, CKMBINDEX, TROPONINI in the last 168 hours.  BNP (last 3 results) No results for input(s): PROBNP in the last 8760 hours. CBG: No results for input(s): GLUCAP in the last 168 hours.  Radiological Exams on Admission: No results found.  Assessment/Plan Principal Problem:   Nausea & vomiting Active Problems:   ADHD (attention deficit hyperactivity disorder)   Hiatal hernia  Nausea and vomiting: Etiology is not clear. EGD showed hiatal hernia and gastritis. Pancreatitis is unlikely given normal lipase. Gallstone or biliary problems are suspected by dr. Loreta AveMann. Other differential diagnoses include gastroenteritis and hepatitis. Currently patient has elevated AST, ALT, without leukocytosis or fever, indicating no severe biliary obstruction or ascending cholangitis. - will admit med-surg bed - Symptomatic treatment: Zofran for nausea, pain control with oxycodone and Dilaudid when necessary - Protonix IV -  IVF: ns 125cc/h - HIDA - check hepatitis panel - HIV ab - check INR/PTT  HDHD: stable. on Adderall and Zoloft at home -continue home medications.  DVT ppx: SQ Heparin    Code Status: Full code Family Communication:  Yes, patient's mother at bed side Disposition Plan: Admit to inpatient   Date of Service 07/25/2014    Lorretta HarpIU, [place] Prue [facility] Pager [phone]  If 7PM-7AM, please contact night-coverage [email or web address] Password [email or web address] 07/25/2014, 1:29 AM

## 2014-07-25 NOTE — Consult Note (Signed)
Referring Provider: No ref. provider found Primary Care Physician:  No PCP Per Patient Primary Gastroenterologist:  Dr. Loreta AveMann  Reason for Consultation:   Nausea and vomiting  HPI: Tyrone PerkingRobert Dixon is a 23 y.o. male with a PMHx of ADHD, GERD, hiatal hernia, and gastritis, who [facility] with complaints of nausea, vomiting, abdominal pain, diarrhea.   Patient reports that he started having nausea and vomiting 3 weeks ago, which has been worsening during last week. He reports that he has an intractable nausea and vomiting. He vomited streaks of blood. He also has a mild diarrhea, with 1-3 bowel movements with loose stools each day.  Last BM yesterday AM.  He states that he may have seen streaks of blood in his stools. He also has abd pain, which is 10/10 in severity, sharp, intermittent, located in the epigastrum, radiating into the RUQ and R shoulder. It is aggravated by laying flat or eating, and improved mildly with GI cocktail, vicodin. He does not have fever, but reports chills. He is seen by Dr. Loreta AveMann, who performed an endoscopy, which showed hiatal hernia and gastritis. He was scheduled for HIDA scan on Monday. Yesterday the pt's mother called Dr. Rhea BeltonPyrtle who was on-call because the pt is still having 2 episodes of emesis daily despite phenergan and GI cocktail as well as Dexilant. Dr. Rhea BeltonPyrtle recommended coming in to the ER for fluids and antiemetics, and to see if HIDA scan could be obtained as inpatient.  Of note, he had US-abd on 10/30 which showed hepatic steatosis.   Work up in the ED demonstrates elevated AST 76, ALT 188, normal bilirubin and ALP; no leukocytosis; lipase normal; urinalysis negative. He was admitted to inpatient for further evaluation and treatment.  Viral hepatitis panel is pending.  HIDA has been ordered for inpatient but likely will not be performed until tomorrow.   Past Medical History  Diagnosis Date  . Acid reflux   . ADHD (attention deficit  hyperactivity disorder)   . Esophagitis   . Hiatal hernia     Past Surgical History  Procedure Laterality Date  . Esophagogastroduodenoscopy endoscopy      Prior to Admission medications   Medication Sig Start Date End Date Taking? Authorizing Provider  albuterol (PROVENTIL HFA;VENTOLIN HFA) 108 (90 BASE) MCG/ACT inhaler Inhale 2 puffs into the lungs every 6 (six) hours as needed for wheezing or shortness of breath.   Yes Historical Provider, MD  Alum & Mag Hydroxide-Simeth (GI COCKTAIL) SUSP suspension Take 30 mLs by mouth 2 (two) times daily as needed for indigestion (nausea). Shake well.   Yes Historical Provider, MD  amphetamine-dextroamphetamine (ADDERALL XR) 20 MG 24 hr capsule Take 20 mg by mouth daily.   Yes Historical Provider, MD  amphetamine-dextroamphetamine (ADDERALL) 20 MG tablet Take 20 mg by mouth daily.   Yes Historical Provider, MD  Dexlansoprazole 30 MG capsule Take 30 mg by mouth daily.   Yes Historical Provider, MD  HYDROcodone-acetaminophen (NORCO/VICODIN) 5-325 MG per tablet Take 2 tablets by mouth every 6 (six) hours as needed for moderate pain. 07/17/14  Yes Fayrene HelperBowie Tran, PA-C  omeprazole (PRILOSEC) 20 MG capsule Take 20 mg by mouth daily.   Yes Historical Provider, MD  ondansetron (ZOFRAN) 4 MG tablet Take 1 tablet (4 mg total) by mouth every 6 (six) hours. 07/17/14  Yes Fayrene HelperBowie Tran, PA-C  pantoprazole (PROTONIX) 40 MG tablet Take 40 mg by mouth daily.   Yes Historical Provider, MD  promethazine (PHENERGAN) 12.5 MG suppository Place  12.5 mg rectally every 6 (six) hours as needed for nausea or vomiting (nausea).   Yes Historical Provider, MD  sertraline (ZOLOFT) 50 MG tablet Take 25 mg by mouth daily.   Yes Historical Provider, MD    Current Facility-Administered Medications  Medication Dose Route Frequency Provider Last Rate Last Dose  . 0.9 %  sodium chloride infusion   Intravenous Continuous Lorretta HarpXilin Niu, MD 125 mL/hr at 07/25/14 0121 125 mL/hr at 07/25/14 0121  .  albuterol (PROVENTIL) (2.5 MG/3ML) 0.083% nebulizer solution 2.5 mg  2.5 mg Inhalation Q6H PRN Lorretta HarpXilin Niu, MD      . amphetamine-dextroamphetamine (ADDERALL XR) 24 hr capsule 20 mg  20 mg Oral Daily Lorretta HarpXilin Niu, MD      . heparin injection 5,000 Units  5,000 Units Subcutaneous 3 times per day Lorretta HarpXilin Niu, MD   5,000 Units at 07/25/14 0556  . HYDROmorphone (DILAUDID) injection 1 mg  1 mg Intravenous Q4H PRN Mercedes Strupp Camprubi-Soms, PA-C   1 mg at 07/25/14 0848  . ondansetron (ZOFRAN) injection 4 mg  4 mg Intravenous Q8H PRN Mercedes Strupp Camprubi-Soms, PA-C   4 mg at 07/25/14 0736  . oxyCODONE (Oxy IR/ROXICODONE) immediate release tablet 10 mg  10 mg Oral Q4H PRN Lorretta HarpXilin Niu, MD   10 mg at 07/25/14 0617  . pantoprazole (PROTONIX) injection 40 mg  40 mg Intravenous Q12H Lorretta HarpXilin Niu, MD   40 mg at 07/25/14 0235  . sertraline (ZOLOFT) tablet 25 mg  25 mg Oral Daily Lorretta HarpXilin Niu, MD        Allergies as of 07/24/2014 - Review Complete 07/24/2014  Allergen Reaction Noted  . Shellfish allergy Anaphylaxis 07/16/2014  . Ibuprofen Rash 07/16/2014    History reviewed. No pertinent family history.  History   Social History  . Marital Status: Single    Spouse Name: N/A    Number of Children: N/A  . Years of Education: N/A   Occupational History  . Not on file.   Social History Main Topics  . Smoking status: Never Smoker   . Smokeless tobacco: Not on file  . Alcohol Use: Yes  . Drug Use: Not on file  . Sexual Activity: Not on file   Other Topics Concern  . Not on file   Social History Narrative    Review of Systems: Ten point ROS is O/W negative except as mentioned in HPI.  Physical Exam: Vital signs in last 24 hours: Temp:  [97.9 F (36.6 C)-98.8 F (37.1 C)] 98.7 F (37.1 C) (11/08 0635) Pulse Rate:  [68-134] 68 (11/08 0635) Resp:  [15-27] 20 (11/08 0635) BP: (114-148)/(70-84) 122/72 mmHg (11/08 0635) SpO2:  [97 %-100 %] 99 % (11/08 0635) Weight:  [186 lb (84.369 kg)] 186 lb  (84.369 kg) (11/08 0531) Last BM Date: 07/24/14 General:  Alert, Well-developed, well-nourished, pleasant and cooperative in NAD; appears somewhat uncomfortable.   Head:  Normocephalic and atraumatic. Eyes:  Sclera clear, no icterus.  Conjunctiva pink. Ears:  Normal auditory acuity. Mouth:  No deformity or lesions.   Lungs:  Clear throughout to auscultation.  No wheezes, crackles, or rhonchi.  Heart:  Regular rate and rhythm; no murmurs, clicks, rubs, or gallops. Abdomen:  Soft, non-distended.  BS present.  Epigastric and RUQ TTP without R/R/G.   Rectal:  Deferred  Msk:  Symmetrical without gross deformities. Pulses:  Normal pulses noted. Extremities:  Without clubbing or edema. Neurologic:  Alert and  oriented x4;  grossly normal neurologically. Skin:  Intact without significant lesions  or rashes. Psych:  Alert and cooperative. Normal mood and affect.  Intake/Output from previous day: 11/07 0701 - 11/08 0700 In: 2381.3 [I.V.:1581.3; IV Piggyback:800] Out: 300 [Urine:300]  Lab Results:  Recent Labs  07/24/14 2048 07/25/14 0512  WBC 6.8 7.7  HGB 16.3 14.6  HCT 45.5 42.6  PLT 220 216   BMET  Recent Labs  07/24/14 2048 07/25/14 0512  NA 138 138  K 4.5 4.2  CL 99 99  CO2 27 26  GLUCOSE 101* 89  BUN 12 12  CREATININE 0.87 0.80  CALCIUM 10.5 9.5   LFT  Recent Labs  07/25/14 0512  PROT 7.2  ALBUMIN 4.1  AST 75*  ALT 170*  ALKPHOS 50  BILITOT 0.4   PT/INR  Recent Labs  07/25/14 0512  LABPROT 13.8  INR 1.05   IMPRESSION:  -23 year old male with 3 weeks of epigastric to RUQ abdominal pain that radiates to right shoulder with nausea/vomiting/poor PO intake and inability to tolerate much PO.  Has elevated transaminases as well.  Viral hepatitis panel is pending.  Differential includes gallbladder issue vs acute viral hepatitis vs celiac (although does not usually present this acutely) vs autoimmune hepatitis.  Also would consider EBV due to recent treatment  of "bronchitis" and elevated LFT's.  PLAN: -Await HIDA scan. -Await results of viral hepatitis panel.  Will also check, IgG, celiac labs, ANA, and EBV IgM. -IVF's, NPO, pain control, anti-emetics. -Continue protonix 40 mg BID IV, carafate suspension TID, and GI prn for now.  Tyrone Dixon D.  07/25/2014, 9:27 AM  Pager number [phone]

## 2014-07-25 NOTE — Progress Notes (Addendum)
[facility] PROGRESS NOTE  Don PerkingRobert Stenberg WUJ:[id number] DOB: 05/29/1991 DOA: 07/24/2014 PCP: No PCP Per Patient  Assessment/Plan  Nausea, vomiting, diarrhea and abdominal pain with mild transaminitis.  Lipase and UA wnl.  Recent US demonstrates no gallstones or biliary dilation, and giving timing and lack of fevers, leukocytosis, cholecystitis unlikely.  Will do HIDA to determine if EF is normal.  Agree with work up for other causes of hepatitis, including viral infections and congenital conditions.  Alternatively, symptoms may be from his gastritis and elevated LFTs from fatty liver.  -  Continue antiemetics and IVF -  D/c oral and IV prn pain medication -  Start Colace and miralax -  NSAID allergy  -  Transaminitis precludes tylenol use -  NPO and PCA off at MN for HIDA in AM -  Carafate and GI cocktail -  Continue BID PPI -  ANA, EBV -  Celiac screen:  TTG, IgG, IgA -  Hepatitis panel pending  ADHD: stable. on Adderall and Zoloft at home -continue home medications.  Diet:  CLD Access:  PIV IVF:  yes Proph:  heparin  Code Status: full Family Communication: patient and his mother Disposition Plan: pending HIDA and further evaluation for transaminitis   Consultants:  GI  Procedures:  None  Antibiotics:  None    HPI/Subjective:  Received 30mg  of oxycodone and 6 mg of IV dilaudid overnight and still wide awake and in severe pain.    Objective: Filed Vitals:   07/25/14 0118 07/25/14 0150 07/25/14 0531 07/25/14 0635  BP: 125/73 124/74  122/72  Pulse: 79 70  68  Temp: 98.8 F (37.1 C) 98.7 F (37.1 C)  98.7 F (37.1 C)  TempSrc: Oral Oral  Oral  Resp: 20 20  20   Height:   5\' 4"  (1.626 m)   Weight:   84.369 kg (186 lb)   SpO2: 98% 98%  99%    Intake/Output Summary (Last 24 hours) at 07/25/14 1253 Last data filed at 07/25/14 1000  Gross per 24 hour  Intake 2881.25 ml  Output    600 ml  Net 2281.25 ml   Filed Weights   07/25/14 0531  Weight:  84.369 kg (186 lb)    Exam:   General:  Obese M, No acute distress  HEENT:  NCAT, MMM  Cardiovascular:  RRR, nl S1, S2 no mrg, 2+ pulses, warm extremities  Respiratory:  CTAB, no increased WOB  Abdomen:   NABS, soft, ND, TTP in the RUQ without rebound or guarding, neg Murphy's sign  MSK:   Normal tone and bulk, no LEE  Neuro:  Grossly intact  Data Reviewed: Basic Metabolic Panel:  Recent Labs Lab 07/24/14 2048 07/25/14 0512  NA 138 138  K 4.5 4.2  CL 99 99  CO2 27 26  GLUCOSE 101* 89  BUN 12 12  CREATININE 0.87 0.80  CALCIUM 10.5 9.5   Liver Function Tests:  Recent Labs Lab 07/24/14 2048 07/25/14 0512  AST 76* 75*  ALT 188* 170*  ALKPHOS 61 50  BILITOT 0.3 0.4  PROT 8.3 7.2  ALBUMIN 4.5 4.1    Recent Labs Lab 07/24/14 2048  LIPASE 31   No results for input(s): AMMONIA in the last 168 hours. CBC:  Recent Labs Lab 07/24/14 2048 07/25/14 0512  WBC 6.8 7.7  NEUTROABS 3.6  --   HGB 16.3 14.6  HCT 45.5 42.6  MCV 87.0 87.1  PLT 220 216   Cardiac Enzymes: No results for input(s): CKTOTAL, CKMB,  CKMBINDEX, TROPONINI in the last 168 hours. BNP (last 3 results) No results for input(s): PROBNP in the last 8760 hours. CBG: No results for input(s): GLUCAP in the last 168 hours.  No results found for this or any previous visit (from the past 240 hour(s)).   Studies: No results found.  Scheduled Meds: . amphetamine-dextroamphetamine  20 mg Oral Daily  . heparin  5,000 Units Subcutaneous 3 times per day  . HYDROmorphone PCA 0.3 mg/mL   Intravenous 6 times per day  . pantoprazole (PROTONIX) IV  40 mg Intravenous Q12H  . sertraline  25 mg Oral Daily  . sucralfate  1 g Oral TID WC & HS   Continuous Infusions:   Principal Problem:   Nausea & vomiting Active Problems:   ADHD (attention deficit hyperactivity disorder)   Hiatal hernia    Time spent: 30 min    Latash Nouri, [facility] East[facility]ACKENZIE  [facility] Pager [phone]. If 7PM-7AM, please  contact night-coverage at [email or web address], password [facility] 07/25/2014, 12:53 PM  LOS: 1 day

## 2014-07-25 NOTE — Plan of Care (Signed)
Problem: Phase I Progression Outcomes Goal: Other Phase I Outcomes/Goals Outcome: Not Applicable Date Met:  91/06/81

## 2014-07-25 NOTE — Plan of Care (Signed)
Problem: Phase I Progression Outcomes Goal: Pain controlled with appropriate interventions Outcome: Progressing Goal: Initial discharge plan identified Outcome: Progressing Goal: Voiding-avoid urinary catheter unless indicated Outcome: Progressing Goal: Hemodynamically stable Outcome: Completed/Met Date Met:  07/25/14

## 2014-07-25 NOTE — ED Notes (Signed)
MD at bedside. 

## 2014-07-25 NOTE — Plan of Care (Signed)
Problem: Phase I Progression Outcomes Goal: Pain controlled with appropriate interventions Outcome: Completed/Met Date Met:  07/25/14 Goal: OOB as tolerated unless otherwise ordered Outcome: Completed/Met Date Met:  07/25/14 Goal: Initial discharge plan identified Outcome: Completed/Met Date Met:  07/25/14 Goal: Voiding-avoid urinary catheter unless indicated Outcome: Completed/Met Date Met:  07/25/14

## 2014-07-26 ENCOUNTER — Observation Stay (HOSPITAL_COMMUNITY): Payer: BC Managed Care – PPO

## 2014-07-26 ENCOUNTER — Encounter (HOSPITAL_COMMUNITY): Payer: Self-pay | Admitting: Radiology

## 2014-07-26 ENCOUNTER — Ambulatory Visit (HOSPITAL_COMMUNITY): Admission: RE | Admit: 2014-07-26 | Payer: BC Managed Care – PPO | Source: Ambulatory Visit

## 2014-07-26 LAB — COMPREHENSIVE METABOLIC PANEL
ALT: 166 U/L — ABNORMAL HIGH (ref 0–53)
AST: 78 U/L — AB (ref 0–37)
Albumin: 3.9 g/dL (ref 3.5–5.2)
Alkaline Phosphatase: 42 U/L (ref 39–117)
Anion gap: 12 (ref 5–15)
BUN: 10 mg/dL (ref 6–23)
CALCIUM: 9.6 mg/dL (ref 8.4–10.5)
CO2: 25 mEq/L (ref 19–32)
CREATININE: 0.82 mg/dL (ref 0.50–1.35)
Chloride: 101 mEq/L (ref 96–112)
GFR calc Af Amer: >90 mL/min (ref >90)
Glucose, Bld: 84 mg/dL (ref 70–99)
Potassium: 3.8 mEq/L (ref 3.7–5.3)
Sodium: 138 mEq/L (ref 137–147)
TOTAL PROTEIN: 7.1 g/dL (ref 6.0–8.3)
Total Bilirubin: 0.6 mg/dL (ref 0.3–1.2)

## 2014-07-26 LAB — HEPATITIS PANEL, ACUTE
HCV Ab: NEGATIVE
HEP A IGM: NONREACTIVE
HEP B C IGM: NONREACTIVE
Hepatitis B Surface Ag: NEGATIVE

## 2014-07-26 LAB — CBC
HCT: 41.1 % (ref 39.0–52.0)
Hemoglobin: 13.9 g/dL (ref 13.0–17.0)
MCH: 29.8 pg (ref 26.0–34.0)
MCHC: 33.8 g/dL (ref 30.0–36.0)
MCV: 88 fL (ref 78.0–100.0)
PLATELETS: 191 10*3/uL (ref 150–400)
RBC: 4.67 MIL/uL (ref 4.22–5.81)
RDW: 12.8 % (ref 11.5–15.5)
WBC: 6.5 10*3/uL (ref 4.0–10.5)

## 2014-07-26 LAB — ANA: ANA: NEGATIVE

## 2014-07-26 LAB — TROPONIN I: Troponin I: <0.3 ng/mL

## 2014-07-26 LAB — TISSUE TRANSGLUTAMINASE, IGA: TISSUE TRANSGLUTAMINASE AB, IGA: 5.7 U/mL (ref <20)

## 2014-07-26 LAB — EPSTEIN-BARR VIRUS VCA, IGM

## 2014-07-26 LAB — HIV ANTIBODY (ROUTINE TESTING W REFLEX): HIV: NONREACTIVE

## 2014-07-26 LAB — FERRITIN: Ferritin: 218 ng/mL (ref 22–322)

## 2014-07-26 LAB — TSH: TSH: 2.08 u[IU]/mL (ref 0.350–4.500)

## 2014-07-26 MED ORDER — IOHEXOL 300 MG/ML  SOLN
100.0000 mL | Freq: Once | INTRAMUSCULAR | Status: AC | PRN
  Administered 2014-07-26: 100 mL via INTRAVENOUS

## 2014-07-26 MED ORDER — PROMETHAZINE HCL 25 MG/ML IJ SOLN
12.5000 mg | Freq: Four times a day (QID) | INTRAMUSCULAR | Status: DC | PRN
  Administered 2014-07-26 – 2014-08-02 (×23): 12.5 mg via INTRAVENOUS
  Filled 2014-07-26 (×24): qty 1

## 2014-07-26 MED ORDER — FLUCONAZOLE IN SODIUM CHLORIDE 200-0.9 MG/100ML-% IV SOLN
200.0000 mg | INTRAVENOUS | Status: DC
  Administered 2014-07-26 – 2014-07-29 (×4): 200 mg via INTRAVENOUS
  Filled 2014-07-26 (×4): qty 100

## 2014-07-26 MED ORDER — HYDROMORPHONE 0.3 MG/ML IV SOLN
INTRAVENOUS | Status: DC
  Administered 2014-07-26: 2.1 mg via INTRAVENOUS
  Administered 2014-07-26 (×2): via INTRAVENOUS
  Administered 2014-07-26: 4.5 mg via INTRAVENOUS
  Administered 2014-07-27: 1.8 mg via INTRAVENOUS
  Administered 2014-07-27: 2.1 mg via INTRAVENOUS
  Administered 2014-07-27: 5.9 mg via INTRAVENOUS
  Administered 2014-07-27: 16:00:00 via INTRAVENOUS
  Administered 2014-07-28: 2.7 mg via INTRAVENOUS
  Filled 2014-07-26 (×3): qty 25

## 2014-07-26 MED ORDER — LORAZEPAM 0.5 MG PO TABS
0.5000 mg | ORAL_TABLET | Freq: Once | ORAL | Status: AC
  Administered 2014-07-26: 0.5 mg via ORAL
  Filled 2014-07-26: qty 1

## 2014-07-26 MED ORDER — OXYCODONE HCL 5 MG PO TABS
5.0000 mg | ORAL_TABLET | Freq: Once | ORAL | Status: AC
  Administered 2014-07-26: 5 mg via ORAL
  Filled 2014-07-26: qty 1

## 2014-07-26 MED ORDER — NALOXONE HCL 0.4 MG/ML IJ SOLN: 0.4000 mg | INTRAMUSCULAR | Status: DC | PRN

## 2014-07-26 MED ORDER — SODIUM CHLORIDE 0.9 % IJ SOLN: 9.0000 mL | INTRAMUSCULAR | Status: DC | PRN

## 2014-07-26 MED ORDER — DIPHENHYDRAMINE HCL 12.5 MG/5ML PO ELIX: 12.5000 mg | ORAL_SOLUTION | Freq: Four times a day (QID) | ORAL | Status: DC | PRN

## 2014-07-26 MED ORDER — NITROGLYCERIN 0.4 MG SL SUBL: 0.4000 mg | SUBLINGUAL_TABLET | SUBLINGUAL | Status: DC | PRN

## 2014-07-26 MED ORDER — HYDROMORPHONE HCL 1 MG/ML IJ SOLN: 1.0000 mg | Freq: Once | INTRAMUSCULAR | Status: AC

## 2014-07-26 MED ORDER — DIPHENHYDRAMINE HCL 50 MG/ML IJ SOLN
12.5000 mg | Freq: Four times a day (QID) | INTRAMUSCULAR | Status: DC | PRN
  Administered 2014-07-26 – 2014-07-27 (×2): 12.5 mg via INTRAVENOUS
  Filled 2014-07-26 (×2): qty 1

## 2014-07-26 MED ORDER — PROMETHAZINE HCL 25 MG/ML IJ SOLN
12.5000 mg | Freq: Once | INTRAMUSCULAR | Status: AC
  Administered 2014-07-26: 12.5 mg via INTRAVENOUS

## 2014-07-26 MED ORDER — OXYCODONE HCL 5 MG PO TABS
10.0000 mg | ORAL_TABLET | Freq: Once | ORAL | Status: AC | PRN
  Administered 2014-07-26: 10 mg via ORAL
  Filled 2014-07-26: qty 2

## 2014-07-26 MED ORDER — IOHEXOL 300 MG/ML  SOLN: 25.0000 mL | INTRAMUSCULAR | Status: AC

## 2014-07-26 NOTE — Progress Notes (Signed)
[facility] PROGRESS NOTE  Tyrone PerkingRobert Dixon AVW:[id number] DOB: 09/17/1990 DOA: 07/24/2014 PCP: No PCP Per Patient  Assessment/Plan  Nausea, vomiting, diarrhea and abdominal pain with mild transaminitis.   Lipase and UA wnl.  US demonstrated no gallstones or biliary dilation.  HIDA scan was negative.  Still having severe pain and requiring high doses of narcotics to control pain.  Consider less common causes of transaminitis and abdominal pain, including cardiac etiologies, congenital etiologies, less common infections.   -  CT abd/pelvis:  No acute disease, fatty liver -  Continue antiemetics and IVF -  Start dilaudid PCA -  Contniue Colace and miralax -  NSAID allergy  -  Transaminitis precludes tylenol use -  NPO at MN, PCA off at 6AM for HIDA with EF -  Carafate and GI cocktail -  Continue BID PPI -  ANA neg, EBV neg -  Celiac screen:  TTG, IgG, IgA pending -  HIV and Hepatitis panel negative -  Anti-smooth muscle, anti-liver/kidney -  Alpha-1 AT, ceruloplasmin, ferritin, Pb level, CMV IgM, TSH & cortisol level, troponin, repeat ECG -  Started on fluconazole by GI for possible candidal infection after antibiotics a few weeks ago  ADHD: stable. on Adderall and Zoloft at home -continue home medications.  Diet:  NPO Access:  PIV IVF:  yes Proph:  heparin  Code Status: full Family Communication: patient and his mother Disposition Plan:  Pending HIDA with EF   Consultants:  GI  Procedures:  None  Antibiotics:  None    HPI/Subjective:  Still having nausea, vomiting, abdominal pain radiating to the right shoulder.  Requiring high doses of pain medication  Objective: Filed Vitals:   07/25/14 0635 07/25/14 1400 07/25/14 2130 07/26/14 0545  BP: 122/72 136/71 123/69 116/81  Pulse: 68 85 71 64  Temp: 98.7 F (37.1 C) 98 F (36.7 C) 98.7 F (37.1 C) 97.8 F (36.6 C)  TempSrc: Oral Oral Oral Oral  Resp: 20 20 18 18   Height:      Weight:      SpO2: 99% 100%  99% 99%    Intake/Output Summary (Last 24 hours) at 07/26/14 1001 Last data filed at 07/26/14 0600  Gross per 24 hour  Intake 1833.75 ml  Output    900 ml  Net 933.75 ml   Filed Weights   07/25/14 0531  Weight: 84.369 kg (186 lb)    Exam:   General:  Obese M, No acute distress, vomiting into trashcan  HEENT:  NCAT, MMM  Cardiovascular:  RRR, nl S1, S2 no mrg, 2+ pulses, warm extremities  Respiratory:  CTAB, no increased WOB  Abdomen:   NABS, soft, ND, TTP in the RUQ and epigastrium  MSK:   Normal tone and bulk, no LEE  Neuro:  Grossly intact  Data Reviewed: Basic Metabolic Panel:  Recent Labs Lab 07/24/14 2048 07/25/14 0512 07/26/14 0418  NA 138 138 138  K 4.5 4.2 3.8  CL 99 99 101  CO2 27 26 25   GLUCOSE 101* 89 84  BUN 12 12 10   CREATININE 0.87 0.80 0.82  CALCIUM 10.5 9.5 9.6   Liver Function Tests:  Recent Labs Lab 07/24/14 2048 07/25/14 0512 07/26/14 0418  AST 76* 75* 78*  ALT 188* 170* 166*  ALKPHOS 61 50 42  BILITOT 0.3 0.4 0.6  PROT 8.3 7.2 7.1  ALBUMIN 4.5 4.1 3.9    Recent Labs Lab 07/24/14 2048  LIPASE 31   No results for input(s): AMMONIA in the last  168 hours. CBC:  Recent Labs Lab 07/24/14 2048 07/25/14 0512 07/26/14 0418  WBC 6.8 7.7 6.5  NEUTROABS 3.6  --   --   HGB 16.3 14.6 13.9  HCT 45.5 42.6 41.1  MCV 87.0 87.1 88.0  PLT 220 216 191   Cardiac Enzymes: No results for input(s): CKTOTAL, CKMB, CKMBINDEX, TROPONINI in the last 168 hours. BNP (last 3 results) No results for input(s): PROBNP in the last 8760 hours. CBG: No results for input(s): GLUCAP in the last 168 hours.  No results found for this or any previous visit (from the past 240 hour(s)).   Studies: Nm Hepatobiliary Liver Func  07/25/2014   CLINICAL DATA:  Intractable abdominal pain. Transaminitis. Nausea and vomiting.  EXAM: NUCLEAR MEDICINE HEPATOBILIARY IMAGING  TECHNIQUE: Sequential images of the abdomen were obtained out to 60 minutes following  intravenous administration of radiopharmaceutical.  RADIOPHARMACEUTICALS:  5.0 Millicurie Tc-6217m Choletec  COMPARISON:  Ultrasound, 07/17/2014.  FINDINGS: There is homogeneous accumulation of radiotracer by the liver with prompt excretion into the intra and extrahepatic biliary tree. Gallbladder filling was seen early during the study with small bowel activity seen during the latter portion. This indicates patency of the cystic duct and common bile duct respectively.  IMPRESSION: Normal hepatobiliary imaging. Patent cystic and common bile ducts. No evidence of acute cholecystitis.   Electronically Signed   By: Amie Portlandavid  Ormond M.D.   On: 07/25/2014 18:57    Scheduled Meds: . amphetamine-dextroamphetamine  20 mg Oral Daily  . docusate sodium  100 mg Oral BID  . heparin  5,000 Units Subcutaneous 3 times per day  . pantoprazole (PROTONIX) IV  40 mg Intravenous Q12H  . polyethylene glycol  17 g Oral Daily  . sertraline  25 mg Oral Daily  . sucralfate  1 g Oral TID WC & HS   Continuous Infusions: . sodium chloride 75 mL/hr at 07/26/14 0830    Principal Problem:   Nausea & vomiting Active Problems:   ADHD (attention deficit hyperactivity disorder)   Hiatal hernia   Transaminitis   RUQ abdominal pain    Time spent: 30 min    Tyrone Dixon, Tyrone Dixon  [facility] Pager [phone]. If 7PM-7AM, please contact night-coverage at [email or web address], password [email or web address] 07/26/2014, 10:01 AM  LOS: 2 days

## 2014-07-26 NOTE — Plan of Care (Signed)
Problem: Phase II Progression Outcomes Goal: Progress activity as tolerated unless otherwise ordered Outcome: Progressing Goal: Vital signs remain stable Outcome: Progressing Goal: Obtain order to discontinue catheter if appropriate Outcome: Not Applicable Date Met:  68/03/21  Problem: Phase III Progression Outcomes Goal: Foley discontinued Outcome: Not Applicable Date Met:  22/48/25

## 2014-07-26 NOTE — Progress Notes (Signed)
Patient is crying in the room, stated this Dilaudid PCA is not working, wants what he had before.  He's very upset and his mom is also upset, texted covering physician.  Medication ordered and given but its still not enough.  His respirations have been as high as 40 bpm  d/t pain and anxiety.

## 2014-07-26 NOTE — Progress Notes (Signed)
Subjective: Unable to tolerate PO.  Complaining of abdominal pain.  Objective: Vital signs in last 24 hours: Temp:  [97.8 F (36.6 C)-98.7 F (37.1 C)] 97.8 F (36.6 C) (11/09 0545) Pulse Rate:  [64-85] 64 (11/09 0545) Resp:  [18-20] 18 (11/09 0545) BP: (116-136)/(69-81) 116/81 mmHg (11/09 0545) SpO2:  [99 %-100 %] 99 % (11/09 0545) Last BM Date: 07/25/14  Intake/Output from previous day: 11/08 0701 - 11/09 0700 In: 2833.8 [I.V.:2833.8] Out: 1200 [Urine:1200] Intake/Output this shift:    General appearance: mild to moderate distress GI: Epigastric, RUQ and chest pain  Lab Results:  Recent Labs  07/24/14 2048 07/25/14 0512 07/26/14 0418  WBC 6.8 7.7 6.5  HGB 16.3 14.6 13.9  HCT 45.5 42.6 41.1  PLT 220 216 191   BMET  Recent Labs  07/24/14 2048 07/25/14 0512 07/26/14 0418  NA 138 138 138  K 4.5 4.2 3.8  CL 99 99 101  CO2 27 26 25   GLUCOSE 101* 89 84  BUN 12 12 10   CREATININE 0.87 0.80 0.82  CALCIUM 10.5 9.5 9.6   LFT  Recent Labs  07/26/14 0418  PROT 7.1  ALBUMIN 3.9  AST 78*  ALT 166*  ALKPHOS 42  BILITOT 0.6   PT/INR  Recent Labs  07/25/14 0512  LABPROT 13.8  INR 1.05   Hepatitis Panel  Recent Labs  07/25/14 0512  HEPBSAG NEGATIVE  HCVAB NEGATIVE  HEPAIGM NON REACTIVE  HEPBIGM NON REACTIVE   C-Diff No results for input(s): CDIFFTOX in the last 72 hours. Fecal Lactopherrin No results for input(s): FECLLACTOFRN in the last 72 hours.  Studies/Results: Nm Hepatobiliary Liver Func  07/25/2014   CLINICAL DATA:  Intractable abdominal pain. Transaminitis. Nausea and vomiting.  EXAM: NUCLEAR MEDICINE HEPATOBILIARY IMAGING  TECHNIQUE: Sequential images of the abdomen were obtained out to 60 minutes following intravenous administration of radiopharmaceutical.  RADIOPHARMACEUTICALS:  5.0 Millicurie [id number] Choletec  COMPARISON:  Ultrasound, 07/17/2014.  FINDINGS: There is homogeneous accumulation of radiotracer by the liver with prompt  excretion into the intra and extrahepatic biliary tree. Gallbladder filling was seen early during the study with small bowel activity seen during the latter portion. This indicates patency of the cystic duct and common bile duct respectively.  IMPRESSION: Normal hepatobiliary imaging. Patent cystic and common bile ducts. No evidence of acute cholecystitis.   Electronically Signed   By: Amie Portlandavid  Ormond M.D.   On: 07/25/2014 18:57   Ct Abdomen Pelvis W Contrast  07/26/2014   CLINICAL DATA:  Right upper quadrant pain for 1 week with nausea and vomiting.  EXAM: CT ABDOMEN AND PELVIS WITH CONTRAST  TECHNIQUE: Multidetector CT imaging of the abdomen and pelvis was performed using the standard protocol following bolus administration of intravenous contrast.  CONTRAST:  100mL OMNIPAQUE IOHEXOL 300 MG/ML  SOLN  COMPARISON:  None.  FINDINGS: Lower chest: Lung bases show no acute findings. Heart size normal. No pericardial or pleural effusion.  Hepatobiliary: Liver is decreased in attenuation diffusely with sparing along the gallbladder fossa. Liver and gallbladder are otherwise unremarkable. No biliary ductal dilatation.  Pancreas: Negative.  Spleen: Negative.  Adrenals/Urinary Tract: Adrenal glands and kidneys are unremarkable. Ureters are decompressed. Bladder is unremarkable.  Stomach/Bowel: Tiny hiatal hernia. Stomach, small bowel, appendix and colon are otherwise unremarkable.  Vascular/Lymphatic: Vascular structures are unremarkable. No pathologically enlarged lymph nodes. Ileocolic mesenteric lymph nodes are subcentimeter in short axis size.  Reproductive: Prostate is normal in size.  Other: No free fluid.  Mesenteries and peritoneum are unremarkable.  Musculoskeletal: No worrisome lytic or sclerotic lesions.  IMPRESSION: 1. No acute findings. 2. Hepatic steatosis.   Electronically Signed   By: Leanna BattlesMelinda  Blietz M.D.   On: 07/26/2014 11:41    Medications:  Scheduled: . amphetamine-dextroamphetamine  20 mg Oral  Daily  . docusate sodium  100 mg Oral BID  . fluconazole (DIFLUCAN) IV  200 mg Intravenous Q24H  . heparin  5,000 Units Subcutaneous 3 times per day  . pantoprazole (PROTONIX) IV  40 mg Intravenous Q12H  . polyethylene glycol  17 g Oral Daily  . sertraline  25 mg Oral Daily  . sucralfate  1 g Oral TID WC & HS   Continuous: . sodium chloride 75 mL/hr at 07/26/14 0830    Assessment/Plan: 1) Nausea/Vomiting. 2) RUQ/Epigastric/Chest pain. 3) LA Grade A esophagitis.   The patient is not responding to PPIs and he requires pain medications.  His HIDA was performed without CCK as it was over the weekend.  I conferred with Dr. Loreta AveMann about the case and she feels that his symptoms are out of proportion to his symptoms.  His history is significant for a Z-pak 3 weeks ago for a bronchitis.  No evidence of Candidal esophagitis on the EGD, but he has significant odynophagia.  Plan: 1) Emperic trial of fluconazole. 2) HIDA with CCK tomorrow.  Hold dilaudid at 6 AM. 3) Continue with supportive care.   LOS: 2 days   Wen Munford D 07/26/2014, 12:51 PM

## 2014-07-27 ENCOUNTER — Inpatient Hospital Stay (HOSPITAL_COMMUNITY): Payer: BC Managed Care – PPO

## 2014-07-27 DIAGNOSIS — F909 Attention-deficit hyperactivity disorder, unspecified type: Secondary | ICD-10-CM

## 2014-07-27 LAB — COMPREHENSIVE METABOLIC PANEL
ALBUMIN: 4.1 g/dL (ref 3.5–5.2)
ALK PHOS: 48 U/L (ref 39–117)
ALT: 148 U/L — AB (ref 0–53)
AST: 67 U/L — ABNORMAL HIGH (ref 0–37)
Anion gap: 13 (ref 5–15)
BUN: 11 mg/dL (ref 6–23)
CO2: 26 mEq/L (ref 19–32)
Calcium: 9.5 mg/dL (ref 8.4–10.5)
Chloride: 101 mEq/L (ref 96–112)
Creatinine, Ser: 0.99 mg/dL (ref 0.50–1.35)
GFR calc non Af Amer: >90 mL/min (ref >90)
GLUCOSE: 89 mg/dL (ref 70–99)
POTASSIUM: 3.8 meq/L (ref 3.7–5.3)
Sodium: 140 mEq/L (ref 137–147)
Total Bilirubin: 0.4 mg/dL (ref 0.3–1.2)
Total Protein: 7.2 g/dL (ref 6.0–8.3)

## 2014-07-27 LAB — IGA: IgA: 144 mg/dL (ref 68–379)

## 2014-07-27 LAB — RAPID URINE DRUG SCREEN, HOSP PERFORMED
AMPHETAMINES: NOT DETECTED
Barbiturates: POSITIVE — AB
Benzodiazepines: NOT DETECTED
Cocaine: NOT DETECTED
Opiates: POSITIVE — AB
Tetrahydrocannabinol: POSITIVE — AB

## 2014-07-27 LAB — CBC
HEMATOCRIT: 40.4 % (ref 39.0–52.0)
HEMOGLOBIN: 13.8 g/dL (ref 13.0–17.0)
MCH: 29.6 pg (ref 26.0–34.0)
MCHC: 34.2 g/dL (ref 30.0–36.0)
MCV: 86.7 fL (ref 78.0–100.0)
Platelets: 205 10*3/uL (ref 150–400)
RBC: 4.66 MIL/uL (ref 4.22–5.81)
RDW: 12.7 % (ref 11.5–15.5)
WBC: 6.6 10*3/uL (ref 4.0–10.5)

## 2014-07-27 LAB — IGG: IgG (Immunoglobin G), Serum: 1010 mg/dL (ref 650–1600)

## 2014-07-27 LAB — ANTI-SMOOTH MUSCLE ANTIBODY, IGG: F-Actin IgG: 12 U (ref <20)

## 2014-07-27 MED ORDER — SINCALIDE 5 MCG IJ SOLR
0.0200 ug/kg | Freq: Once | INTRAMUSCULAR | Status: DC
Start: 2014-07-27 — End: 2014-08-02
  Filled 2014-07-27: qty 5

## 2014-07-27 MED ORDER — LORAZEPAM 2 MG/ML IJ SOLN
2.0000 mg | INTRAMUSCULAR | Status: DC | PRN
  Administered 2014-07-27 – 2014-08-02 (×22): 2 mg via INTRAVENOUS
  Filled 2014-07-27 (×22): qty 1

## 2014-07-27 MED ORDER — LORAZEPAM 2 MG/ML IJ SOLN
1.0000 mg | INTRAMUSCULAR | Status: DC | PRN
  Administered 2014-07-27: 1 mg via INTRAVENOUS
  Filled 2014-07-27: qty 1

## 2014-07-27 MED ORDER — LORAZEPAM 2 MG/ML IJ SOLN
INTRAMUSCULAR | Status: AC
  Administered 2014-07-27: 1 mg
  Filled 2014-07-27: qty 1

## 2014-07-27 MED ORDER — TECHNETIUM TC 99M MEBROFENIN IV KIT
4.9000 | PACK | Freq: Once | INTRAVENOUS | Status: AC | PRN
  Administered 2014-07-27: 4.9 via INTRAVENOUS

## 2014-07-27 NOTE — Consult Note (Signed)
Reason for Consult: Possible biliary disease Referring Physician:   Dr. Verdia Kuba  Tyrone Dixon is an 23 y.o. male.  HPI: Pt has been having pain mid epigastric area and going up to his left chest.  He was home and saw his PCP back on 07/04/14 who wanted to do and EGD, but he had to go back to school.  Planned for Thanksgiving.  He has continued to have pain, nausea and vomiting more on than off since then  He as seen by Dr. Collene Mares and underwent EGD showing a hiatal hernia and gastritis.  He was scheduled for HIDA scan this week but had significant pain nausea and vomiting thru out the weekend and was brought to the ED here on 07/24/14.  He has been afebrile, BP up with pain.  Labs show slightly elevated AST and ALT.  Normal WBC.  Auto immune studies and hepatitis panels are also negative.  Drug screen 07/27/14, positive for:  Benzodiazepine, opiatesand THC were positive.  CT of the Abdomen on admit show no acute findings with some hepatic steatosis.  Hida scan on 10/8, was negative.  HIDA scan with EF today shows EF 36%, with normal cystic duct and common bile duct.  We are ask to see.   Past Medical History  Diagnosis Date  . Acid reflux   . ADHD (attention deficit hyperactivity disorder)   . Esophagitis   . Hiatal hernia     Past Surgical History  Procedure Laterality Date  . Esophagogastroduodenoscopy endoscopy      History reviewed. No pertinent family history.  He is adopted no family hx.  Social History:  reports that he has never smoked. He does not have any smokeless tobacco history on file. He reports that he drinks alcohol. His drug history is not on file. Tobacco:  None Drugs:  Admits to [place] but none since 04/2014 ETOH:  Heavy use in prior school.  Social now, but to sick lately to drink anything for at least 3 weeks. Allergies:  Allergies  Allergen Reactions  . Shellfish Allergy Anaphylaxis  . Ibuprofen Rash    Medications:  Prior to Admission:  Prescriptions prior to  admission  Medication Sig Dispense Refill Last Dose  . albuterol (PROVENTIL HFA;VENTOLIN HFA) 108 (90 BASE) MCG/ACT inhaler Inhale 2 puffs into the lungs every 6 (six) hours as needed for wheezing or shortness of breath.   Past Week at Unknown time  . Alum & Mag Hydroxide-Simeth (GI COCKTAIL) SUSP suspension Take 30 mLs by mouth 2 (two) times daily as needed for indigestion (nausea). Shake well.   07/24/2014 at Unknown time  . amphetamine-dextroamphetamine (ADDERALL XR) 20 MG 24 hr capsule Take 20 mg by mouth daily.   Past Week at Unknown time  . amphetamine-dextroamphetamine (ADDERALL) 20 MG tablet Take 20 mg by mouth daily.   Past Week at Unknown time  . Dexlansoprazole 30 MG capsule Take 30 mg by mouth daily.   07/24/2014 at Unknown time  . HYDROcodone-acetaminophen (NORCO/VICODIN) 5-325 MG per tablet Take 2 tablets by mouth every 6 (six) hours as needed for moderate pain. 14 tablet 0 07/24/2014 at Unknown time  . omeprazole (PRILOSEC) 20 MG capsule Take 20 mg by mouth daily.   Past Week at Unknown time  . ondansetron (ZOFRAN) 4 MG tablet Take 1 tablet (4 mg total) by mouth every 6 (six) hours. 12 tablet 0 07/24/2014 at Unknown time  . pantoprazole (PROTONIX) 40 MG tablet Take 40 mg by mouth daily.   Past  Week at Unknown time  . promethazine (PHENERGAN) 12.5 MG suppository Place 12.5 mg rectally every 6 (six) hours as needed for nausea or vomiting (nausea).   07/24/2014 at Unknown time  . sertraline (ZOLOFT) 50 MG tablet Take 25 mg by mouth daily.   Past Week at Unknown time   Scheduled: . amphetamine-dextroamphetamine  20 mg Oral Daily  . docusate sodium  100 mg Oral BID  . fluconazole (DIFLUCAN) IV  200 mg Intravenous Q24H  . heparin  5,000 Units Subcutaneous 3 times per day  . HYDROmorphone PCA 0.3 mg/mL   Intravenous 6 times per day  . pantoprazole (PROTONIX) IV  40 mg Intravenous Q12H  . polyethylene glycol  17 g Oral Daily  . sertraline  25 mg Oral Daily  . sincalide  0.02 mcg/kg  Intravenous Once  . sucralfate  1 g Oral TID WC & HS   Continuous: . sodium chloride 75 mL/hr at 07/27/14 1403   VPX:[id number], diphenhydrAMINE **OR** diphenhydrAMINE, gi cocktail, LORazepam, naloxone **AND** sodium chloride, nitroGLYCERIN, ondansetron (ZOFRAN) IV, promethazine Anti-infectives    Start     Dose/Rate Route Frequency Ordered Stop   07/26/14 1400  fluconazole (DIFLUCAN) IVPB 200 mg     200 mg100 mL/hr over 60 Minutes Intravenous Every 24 hours 07/26/14 1251        Results for orders placed or performed during the hospital encounter of 07/24/14 (from the past 48 hour(s))  Comprehensive metabolic panel     Status: Abnormal   Collection Time: 07/26/14  4:18 AM  Result Value Ref Range   Sodium 138 137 - 147 mEq/L   Potassium 3.8 3.7 - 5.3 mEq/L   Chloride 101 96 - 112 mEq/L   CO2 25 19 - 32 mEq/L   Glucose, Bld 84 70 - 99 mg/dL   BUN 10 6 - 23 mg/dL   Creatinine, Ser 0.82 0.50 - 1.35 mg/dL   Calcium 9.6 8.4 - 10.5 mg/dL   Total Protein 7.1 6.0 - 8.3 g/dL   Albumin 3.9 3.5 - 5.2 g/dL   AST 78 (H) 0 - 37 U/L   ALT 166 (H) 0 - 53 U/L   Alkaline Phosphatase 42 39 - 117 U/L   Total Bilirubin 0.6 0.3 - 1.2 mg/dL   GFR calc non Af Amer >90 >90 mL/min   GFR calc Af Amer >90 >90 mL/min    Comment: (NOTE) The eGFR has been calculated using the CKD EPI equation. This calculation has not been validated in all clinical situations. eGFR's persistently <90 mL/min signify possible Chronic Kidney Disease.    Anion gap 12 5 - 15  CBC     Status: None   Collection Time: 07/26/14  4:18 AM  Result Value Ref Range   WBC 6.5 4.0 - 10.5 K/uL   RBC 4.67 4.22 - 5.81 MIL/uL   Hemoglobin 13.9 13.0 - 17.0 g/dL   HCT 41.1 39.0 - 52.0 %   MCV 88.0 78.0 - 100.0 fL   MCH 29.8 26.0 - 34.0 pg   MCHC 33.8 30.0 - 36.0 g/dL   RDW 12.8 11.5 - 15.5 %   Platelets 191 150 - 400 K/uL  Troponin I     Status: None   Collection Time: 07/26/14  1:45 PM  Result Value Ref Range   Troponin I <0.30  <0.30 ng/mL    Comment:        Due to the release kinetics of cTnI, a negative result within the first hours of the onset  of symptoms does not rule out myocardial infarction with certainty. If myocardial infarction is still suspected, repeat the test at appropriate intervals.   Ferritin     Status: None   Collection Time: 07/26/14  1:45 PM  Result Value Ref Range   Ferritin 218 22 - 322 ng/mL    Comment: Performed at [facility]  TSH     Status: None   Collection Time: 07/26/14  1:45 PM  Result Value Ref Range   TSH 2.080 0.350 - 4.500 uIU/mL    Comment: Performed at [facility]  Anti-smooth muscle antibody, IgG     Status: None   Collection Time: 07/26/14  1:45 PM  Result Value Ref Range   F-Actin IgG 12 <20 U    Comment: (NOTE)                           Reference Range                 Negative                      <20                 Weak Positive                 20-30                 Moderate to Strong Positive   >30 This ELISA assay is based on purified F-Actin IgG antibodies. IgG antibodies to F-Actin are present in approximately 75% of patients with autoimmune hepatitis type 1, 65% with autoimmune cholangitis, 30% with primary biliary cirrhosis, and 2% of the healthy population. Actin values obtained with different manufacturers assay methods may not be used interchangeably. Performed at [facility]   Comprehensive metabolic panel     Status: Abnormal   Collection Time: 07/27/14  4:33 AM  Result Value Ref Range   Sodium 140 137 - 147 mEq/L   Potassium 3.8 3.7 - 5.3 mEq/L   Chloride 101 96 - 112 mEq/L   CO2 26 19 - 32 mEq/L   Glucose, Bld 89 70 - 99 mg/dL   BUN 11 6 - 23 mg/dL   Creatinine, Ser 0.99 0.50 - 1.35 mg/dL   Calcium 9.5 8.4 - 10.5 mg/dL   Total Protein 7.2 6.0 - 8.3 g/dL   Albumin 4.1 3.5 - 5.2 g/dL   AST 67 (H) 0 - 37 U/L   ALT 148 (H) 0 - 53 U/L   Alkaline Phosphatase 48 39 - 117 U/L   Total Bilirubin 0.4 0.3 - 1.2 mg/dL    GFR calc non Af Amer >90 >90 mL/min   GFR calc Af Amer >90 >90 mL/min    Comment: (NOTE) The eGFR has been calculated using the CKD EPI equation. This calculation has not been validated in all clinical situations. eGFR's persistently <90 mL/min signify possible Chronic Kidney Disease.    Anion gap 13 5 - 15  CBC     Status: None   Collection Time: 07/27/14  4:33 AM  Result Value Ref Range   WBC 6.6 4.0 - 10.5 K/uL   RBC 4.66 4.22 - 5.81 MIL/uL   Hemoglobin 13.8 13.0 - 17.0 g/dL   HCT 40.4 39.0 - 52.0 %   MCV 86.7 78.0 - 100.0 fL   MCH 29.6 26.0 - 34.0 pg   MCHC 34.2 30.0 -  36.0 g/dL   RDW 12.7 11.5 - 15.5 %   Platelets 205 150 - 400 K/uL  Urine rapid drug screen (hosp performed)     Status: Abnormal   Collection Time: 07/27/14  8:31 AM  Result Value Ref Range   Opiates POSITIVE (A) NONE DETECTED   Cocaine NONE DETECTED NONE DETECTED   Benzodiazepines NONE DETECTED NONE DETECTED   Amphetamines NONE DETECTED NONE DETECTED   Tetrahydrocannabinol POSITIVE (A) NONE DETECTED   Barbiturates POSITIVE (A) NONE DETECTED    Comment:        DRUG SCREEN FOR MEDICAL PURPOSES ONLY.  IF CONFIRMATION IS NEEDED FOR ANY PURPOSE, NOTIFY LAB WITHIN 5 DAYS.        LOWEST DETECTABLE LIMITS FOR URINE DRUG SCREEN Drug Class       Cutoff (ng/mL) Amphetamine      1000 Barbiturate      200 Benzodiazepine   735 Tricyclics       329 Opiates          300 Cocaine          300 THC              50     Nm Hepatobiliary Liver Func  07/25/2014   CLINICAL DATA:  Intractable abdominal pain. Transaminitis. Nausea and vomiting.  EXAM: NUCLEAR MEDICINE HEPATOBILIARY IMAGING  TECHNIQUE: Sequential images of the abdomen were obtained out to 60 minutes following intravenous administration of radiopharmaceutical.  RADIOPHARMACEUTICALS:  5.0 Millicurie [id number] Choletec  COMPARISON:  Ultrasound, 07/17/2014.  FINDINGS: There is homogeneous accumulation of radiotracer by the liver with prompt excretion into the  intra and extrahepatic biliary tree. Gallbladder filling was seen early during the study with small bowel activity seen during the latter portion. This indicates patency of the cystic duct and common bile duct respectively.  IMPRESSION: Normal hepatobiliary imaging. Patent cystic and common bile ducts. No evidence of acute cholecystitis.   Electronically Signed   By: Lajean Manes M.D.   On: 07/25/2014 18:57   Ct Abdomen Pelvis W Contrast  07/26/2014   CLINICAL DATA:  Right upper quadrant pain for 1 week with nausea and vomiting.  EXAM: CT ABDOMEN AND PELVIS WITH CONTRAST  TECHNIQUE: Multidetector CT imaging of the abdomen and pelvis was performed using the standard protocol following bolus administration of intravenous contrast.  CONTRAST:  132m OMNIPAQUE IOHEXOL 300 MG/ML  SOLN  COMPARISON:  None.  FINDINGS: Lower chest: Lung bases show no acute findings. Heart size normal. No pericardial or pleural effusion.  Hepatobiliary: Liver is decreased in attenuation diffusely with sparing along the gallbladder fossa. Liver and gallbladder are otherwise unremarkable. No biliary ductal dilatation.  Pancreas: Negative.  Spleen: Negative.  Adrenals/Urinary Tract: Adrenal glands and kidneys are unremarkable. Ureters are decompressed. Bladder is unremarkable.  Stomach/Bowel: Tiny hiatal hernia. Stomach, small bowel, appendix and colon are otherwise unremarkable.  Vascular/Lymphatic: Vascular structures are unremarkable. No pathologically enlarged lymph nodes. Ileocolic mesenteric lymph nodes are subcentimeter in short axis size.  Reproductive: Prostate is normal in size.  Other: No free fluid.  Mesenteries and peritoneum are unremarkable.  Musculoskeletal: No worrisome lytic or sclerotic lesions.  IMPRESSION: 1. No acute findings. 2. Hepatic steatosis.   Electronically Signed   By: MLorin PicketM.D.   On: 07/26/2014 11:41   Nm Hepato W/eject Fract  07/27/2014   CLINICAL DATA:  Abdominal pain, nausea and vomiting  normal HIDA scan 07/25/2014. Evaluate gallbladder function.  EXAM: NUCLEAR MEDICINE HEPATOBILIARY IMAGING WITH GALLBLADDER EF  TECHNIQUE: Sequential  images of the abdomen were obtained out to 60 minutes following intravenous administration of radiopharmaceutical. After slow intravenous infusion of 1.7 micrograms Cholecystokinin, gallbladder ejection fraction was determined.  RADIOPHARMACEUTICALS:  4.9 Millicurie [id number] Choletec  COMPARISON:  11/ 8/15, HIDA scan, CT scan 07/26/2014  FINDINGS: Patient was moving current exam. This may calculating ejection fraction difficult.  There is homogeneous radiotracer uptake within the liver. The gallbladder is evident by 10 min. Counts are not present within the small bowel by 60 min. Administration of CCK resulted in minimal contraction of the gallbladder. Gallbladder ejection fraction equals 36%. At 30 min, normal ejection fraction is greater than 30%.  The patient did experience symptoms during CCK infusion.  IMPRESSION: 1. Borderline normal gallbladder ejection fraction at 36%. 2. Patent cystic duct and common bile duct.   Electronically Signed   By: Suzy Bouchard M.D.   On: 07/27/2014 14:26    ROS Blood pressure 143/119, pulse 121, temperature 98.2 F (36.8 C), temperature source Oral, resp. rate 20, height 5' 4"  (1.626 m), weight 84.369 kg (186 lb), SpO2 99 %. Physical Exam  Assessment/Plan: 1.  Abdominal pain, with intractable nausea and vomiting  2.  Mildly elevated LFT's 3.  Hiatal Hernia and gastritis by EGD  Plan:  Dr. Marcello Moores has reviewed and does not think there is any surgical indication at this time.    Fumiko Cham 07/27/2014, 4:56 PM

## 2014-07-27 NOTE — Progress Notes (Addendum)
[facility] PROGRESS NOTE  Tyrone PerkingRobert Dixon AVW:[id number] DOB: 08/04/1991 DOA: 07/24/2014 PCP: No PCP Per Patient  Brief summary  Tyrone PerkingRobert Dixon is a 23 y.o. male with a PMHx of ADHD, GERD, hiatal hernia, and gastritis, who presents with nausea, vomiting, abdominal pain, diarrhea.  Patient reported that he started having nausea and vomiting 3 weeks ago, which have worsened. He also reported mild diarrhea, with 1-3 bowel movements with loose stools each day.  He had 10/10, sharp, intermittent LUQ/epigastric pain, radiating into the RUQ and R shoulder. It was aggravated by laying flat or eating, and improved mildly with GI cocktail and vicodin.  Prior to admission, he had a normal RUQ US and upper endoscopy by Dr. Loreta AveMann, which showed a small hiatal hernia and gastritis. He was scheduled for HIDA scan but came to the ER instead because of severity of symptoms. In the ER, AST 76, ALT 188, normal bilirubin; No leukocytosis; Lipase 31; Urinalysis negative.  Since admission, his transaminitis has gradually started to improve.  He has had a normal CT abd/pelvis except for fatty liver.  HIDA with ejection fraction was negative for cholecystitis but he had a borderline decreased EF.  GI has been following and general surgery consulted this afternoon after results of HIDA.  Patient has required very high doses of narcotic to control pain and is anxious.    Assessment/Plan  Nausea, vomiting, diarrhea and abdominal pain with mild transaminitis.   Lipase and UA wnl.  US demonstrated no gallstones or biliary dilation.    HIDA scan was negative.  Still having severe pain and requiring high doses of narcotics to control pain, clearly narcotic tolerant.  Consider less common causes of transaminitis and abdominal pain, including cardiac etiologies, congenital etiologies, less common infections.   -  CT abd/pelvis:  No acute disease, fatty liver -  Continue antiemetics and IVF -  Continue dilaudid PCA -  Continue  Colace and miralax -  NSAID allergy  -  Transaminitis precludes tylenol use -  Carafate and GI cocktail -  Continue BID PPI -  ANA neg, EBV neg -  Celiac screen (TTG, IgG, IgA):  negative -  HIV NR -  Hepatitis panel negative -  Anti-smooth muscle neg, anti-liver/kidney PENDING -  Alpha-1 AT PENDING, ceruloplasmin PENDING, ferritin, Pb level, CMV IgM, TSH 2.08 & cortisol level PENDING, troponin wnl, repeat ECG stable from prior (second ECG 11/9 entered in error from another patient) -  Started on fluconazole by GI for possible candidal infection  ADHD with anxiety:  Appears anxious -  Continue Adderall and Zoloft  -  Added ativan prn  Polysubstance abuse -  Urine drug screen positive for barbiturates and THC  Diet:  CLD Access:  PIV IVF:  yes Proph:  heparin  Code Status: full Family Communication: patient and his mother Disposition Plan:  Pending surgery evaluation for possible gallbladder    Consultants:  GI  General Surgery  Procedures:  HIDA 11/8:  Neg  CT abd/pelvis 11/9:  Hepatic steatosis  NM liver with EF 11/10:  Symptoms during administration of CCK and borderline low EF  Antibiotics:  None    HPI/Subjective:  Had severe anxiety and pain last night on PCA.  Thought his PCA was not working because after his doses the machine would read "complete."  Received several additional doses of pain medication on top of PCA and ativan and was still awake.  Pain continues to radiate to the right shoulder and is worse with belching.  Objective: Filed Vitals:   07/27/14 1300 07/27/14 1354 07/27/14 1400 07/27/14 1602  BP:  129/87 143/119   Pulse:  83 121   Temp:  95.9 F (35.5 C) 98.2 F (36.8 C)   TempSrc:  Oral Oral   Resp:   20 20  Height:      Weight: 84.369 kg (186 lb)     SpO2:  98% 95% 99%    Intake/Output Summary (Last 24 hours) at 07/27/14 1615 Last data filed at 07/27/14 1403  Gross per 24 hour  Intake   1500 ml  Output   1020 ml  Net     480 ml   Filed Weights   07/25/14 0531 07/27/14 1300  Weight: 84.369 kg (186 lb) 84.369 kg (186 lb)    Exam:   General:  Obese M, No acute distress initially but became distressed the more we talked  HEENT:  NCAT, MMM  Cardiovascular:  RRR, nl S1, S2 no mrg, 2+ pulses, warm extremities  Respiratory:  CTAB, no increased WOB  Abdomen:   NABS, soft, ND, TTP in the RUQ and epigastrium  MSK:   Normal tone and bulk, no LEE  Neuro:  Grossly intact  Data Reviewed: Basic Metabolic Panel:  Recent Labs Lab 07/24/14 2048 07/25/14 0512 07/26/14 0418 07/27/14 0433  NA 138 138 138 140  K 4.5 4.2 3.8 3.8  CL 99 99 101 101  CO2 27 26 25 26   GLUCOSE 101* 89 84 89  BUN 12 12 10 11   CREATININE 0.87 0.80 0.82 0.99  CALCIUM 10.5 9.5 9.6 9.5   Liver Function Tests:  Recent Labs Lab 07/24/14 2048 07/25/14 0512 07/26/14 0418 07/27/14 0433  AST 76* 75* 78* 67*  ALT 188* 170* 166* 148*  ALKPHOS 61 50 42 48  BILITOT 0.3 0.4 0.6 0.4  PROT 8.3 7.2 7.1 7.2  ALBUMIN 4.5 4.1 3.9 4.1    Recent Labs Lab 07/24/14 2048  LIPASE 31   No results for input(s): AMMONIA in the last 168 hours. CBC:  Recent Labs Lab 07/24/14 2048 07/25/14 0512 07/26/14 0418 07/27/14 0433  WBC 6.8 7.7 6.5 6.6  NEUTROABS 3.6  --   --   --   HGB 16.3 14.6 13.9 13.8  HCT 45.5 42.6 41.1 40.4  MCV 87.0 87.1 88.0 86.7  PLT 220 216 191 205   Cardiac Enzymes:  Recent Labs Lab 07/26/14 1345  TROPONINI <0.30   BNP (last 3 results) No results for input(s): PROBNP in the last 8760 hours. CBG: No results for input(s): GLUCAP in the last 168 hours.  No results found for this or any previous visit (from the past 240 hour(s)).   Studies: Nm Hepatobiliary Liver Func  07/25/2014   CLINICAL DATA:  Intractable abdominal pain. Transaminitis. Nausea and vomiting.  EXAM: NUCLEAR MEDICINE HEPATOBILIARY IMAGING  TECHNIQUE: Sequential images of the abdomen were obtained out to 60 minutes following intravenous  administration of radiopharmaceutical.  RADIOPHARMACEUTICALS:  5.0 Millicurie Tc-583m Choletec  COMPARISON:  Ultrasound, 07/17/2014.  FINDINGS: There is homogeneous accumulation of radiotracer by the liver with prompt excretion into the intra and extrahepatic biliary tree. Gallbladder filling was seen early during the study with small bowel activity seen during the latter portion. This indicates patency of the cystic duct and common bile duct respectively.  IMPRESSION: Normal hepatobiliary imaging. Patent cystic and common bile ducts. No evidence of acute cholecystitis.   Electronically Signed   By: Amie Portlandavid  Ormond M.D.   On: 07/25/2014 18:57  Ct Abdomen Pelvis W Contrast  07/26/2014   CLINICAL DATA:  Right upper quadrant pain for 1 week with nausea and vomiting.  EXAM: CT ABDOMEN AND PELVIS WITH CONTRAST  TECHNIQUE: Multidetector CT imaging of the abdomen and pelvis was performed using the standard protocol following bolus administration of intravenous contrast.  CONTRAST:  OMNIPAQUE IOHEXOL 300 MG/ML  SOLN  COMPARISON:  None.  FINDINGS: Lower chest: Lung bases show no acute findings. Heart size normal. No pericardial or pleural effusion.  Hepatobiliary: Liver is decreased in attenuation diffusely with sparing along the gallbladder fossa. Liver and gallbladder are otherwise unremarkable. No biliary ductal dilatation.  Pancreas: Negative.  Spleen: Negative.  Adrenals/Urinary Tract: Adrenal glands and kidneys are unremarkable. Ureters are decompressed. Bladder is unremarkable.  Stomach/Bowel: Tiny hiatal hernia. Stomach, small bowel, appendix and colon are otherwise unremarkable.  Vascular/Lymphatic: Vascular structures are unremarkable. No pathologically enlarged lymph nodes. Ileocolic mesenteric lymph nodes are subcentimeter in Jerry Clyne axis size.  Reproductive: Prostate is normal in size.  Other: No free fluid.  Mesenteries and peritoneum are unremarkable.  Musculoskeletal: No worrisome lytic or sclerotic  lesions.  IMPRESSION: 1. No acute findings. 2. Hepatic steatosis.   Electronically Signed   By: Leanna Battles M.D.   On: 07/26/2014 11:41   Nm Hepato W/eject Fract  07/27/2014   CLINICAL DATA:  Abdominal pain, nausea and vomiting normal HIDA scan 07/25/2014. Evaluate gallbladder function.  EXAM: NUCLEAR MEDICINE HEPATOBILIARY IMAGING WITH GALLBLADDER EF  TECHNIQUE: Sequential images of the abdomen were obtained out to 60 minutes following intravenous administration of radiopharmaceutical. After slow intravenous infusion of 1.7 micrograms Cholecystokinin, gallbladder ejection fraction was determined.  RADIOPHARMACEUTICALS:  4.9 Millicurie Tc-61m Choletec  COMPARISON:  11/ 8/15, HIDA scan, CT scan 07/26/2014  FINDINGS: Patient was moving current exam. This may calculating ejection fraction difficult.  There is homogeneous radiotracer uptake within the liver. The gallbladder is evident by 10 min. Counts are not present within the small bowel by 60 min. Administration of CCK resulted in minimal contraction of the gallbladder. Gallbladder ejection fraction equals 36%. At 30 min, normal ejection fraction is greater than 30%.  The patient did experience symptoms during CCK infusion.  IMPRESSION: 1. Borderline normal gallbladder ejection fraction at 36%. 2. Patent cystic duct and common bile duct.   Electronically Signed   By: Genevive Bi M.D.   On: 07/27/2014 14:26    Scheduled Meds: . amphetamine-dextroamphetamine  20 mg Oral Daily  . docusate sodium  100 mg Oral BID  . fluconazole (DIFLUCAN) IV  200 mg Intravenous Q24H  . heparin  5,000 Units Subcutaneous 3 times per day  . HYDROmorphone PCA 0.3 mg/mL   Intravenous 6 times per day  . pantoprazole (PROTONIX) IV  40 mg Intravenous Q12H  . polyethylene glycol  17 g Oral Daily  . sertraline  25 mg Oral Daily  . sincalide  0.02 mcg/kg Intravenous Once  . sucralfate  1 g Oral TID WC & HS   Continuous Infusions: . sodium chloride 75 mL/hr at 07/27/14  1403    Principal Problem:   Nausea & vomiting Active Problems:   ADHD (attention deficit hyperactivity disorder)   Hiatal hernia   Transaminitis   RUQ abdominal pain    Time spent: 30 min    Airen Dales, [facility]  [facility] Pager [phone]. If 7PM-7AM, please contact night-coverage at [email or web address], password [facility] 07/27/2014, 4:15 PM  LOS: 3 days

## 2014-07-27 NOTE — Progress Notes (Signed)
ATTENDING ADDENDUM:  I personally reviewed patient's record, examined the patient, and formulated the following assessment and plan:  Pt with normal RUQ US, Normal HIDA and EF (anything above 35% is normal and not borderline) and normal CT.  I see no surgical cause for his pain and do not think the significant risks of surgery outweigh any benefit he may receive.  I regret that I can not help this young man, who appears to be in significant pain and distress.  Will sign off.  Please call with any questions.

## 2014-07-27 NOTE — Progress Notes (Signed)
Subjective: Since I last evaluated the patient, he continues to have RUQ pain radiating to the back with nausea. His mother is sitting besides him. She is very distraught by the news she received from Dr. Romie LeveeAlicia Thomas that the problem may not be his gallbladder after all. He is very anxious and tense and feels his situation is worsening day by day and nobody can figure out what the real problem is.   Objective: Vital signs in last 24 hours: Temp:  [95.9 F (35.5 C)-98.6 F (37 C)] 98.2 F (36.8 C) (11/10 1400) Pulse Rate:  [73-121] 121 (11/10 1400) Resp:  [10-20] 20 (11/10 1602) BP: (111-151)/(73-123) 143/119 mmHg (11/10 1400) SpO2:  [20 %-100 %] 99 % (11/10 1602) FiO2 (%):  [1 %] 1 % (11/10 1400) Weight:  [84.369 kg (186 lb)] 84.369 kg (186 lb) (11/10 1300) Last BM Date: 07/26/14  Intake/Output from previous day: 11/09 0701 - 11/10 0700 In: 1865 [I.V.:1765; IV Piggyback:100] Out: 0  Intake/Output this shift: Total I/O In: 335 [I.V.:335] Out: 1020 [Urine:1020]  General appearance: alert, cooperative, fatigued, severe distress and moderately obese Resp: clear to auscultation bilaterally Cardio: regular rate and rhythm, S1, S2 normal, no murmur, click, rub or gallop GI: soft with significant RUQ tenderness on palpation with significant gaurding but without rebound or rigidity; he has hypoactive bowel sounds; no masses,  no organomegaly appreciated. Extremities: extremities normal, atraumatic, no cyanosis or edema  Lab Results:  Recent Labs  07/25/14 0512 07/26/14 0418 07/27/14 0433  WBC 7.7 6.5 6.6  HGB 14.6 13.9 13.8  HCT 42.6 41.1 40.4  PLT 216 191 205   BMET  Recent Labs  07/25/14 0512 07/26/14 0418 07/27/14 0433  NA 138 138 140  K 4.2 3.8 3.8  CL 99 101 101  CO2 26 25 26   GLUCOSE 89 84 89  BUN 12 10 11   CREATININE 0.80 0.82 0.99  CALCIUM 9.5 9.6 9.5   LFT  Recent Labs  07/27/14 0433  PROT 7.2  ALBUMIN 4.1  AST 67*  ALT 148*  ALKPHOS 48  BILITOT  0.4   PT/INR  Recent Labs  07/25/14 0512  LABPROT 13.8  INR 1.05   Hepatitis Panel  Recent Labs  07/25/14 0512  HEPBSAG NEGATIVE  HCVAB NEGATIVE  HEPAIGM NON REACTIVE  HEPBIGM NON REACTIVE   Studies/Results: Nm Hepatobiliary Liver Func  07/25/2014   CLINICAL DATA:  Intractable abdominal pain. Transaminitis. Nausea and vomiting.  EXAM: NUCLEAR MEDICINE HEPATOBILIARY IMAGING  TECHNIQUE: Sequential images of the abdomen were obtained out to 60 minutes following intravenous administration of radiopharmaceutical.  RADIOPHARMACEUTICALS:  5.0 Millicurie Tc-6433m Choletec  COMPARISON:  Ultrasound, 07/17/2014.  FINDINGS: There is homogeneous accumulation of radiotracer by the liver with prompt excretion into the intra and extrahepatic biliary tree. Gallbladder filling was seen early during the study with small bowel activity seen during the latter portion. This indicates patency of the cystic duct and common bile duct respectively.  IMPRESSION: Normal hepatobiliary imaging. Patent cystic and common bile ducts. No evidence of acute cholecystitis.   Electronically Signed   By: Amie Portlandavid  Ormond M.D.   On: 07/25/2014 18:57   Ct Abdomen Pelvis W Contrast  07/26/2014   CLINICAL DATA:  Right upper quadrant pain for 1 week with nausea and vomiting.  EXAM: CT ABDOMEN AND PELVIS WITH CONTRAST  TECHNIQUE: Multidetector CT imaging of the abdomen and pelvis was performed using the standard protocol following bolus administration of intravenous contrast.  CONTRAST:  100mL OMNIPAQUE IOHEXOL 300 MG/ML  SOLN  COMPARISON:  None.  FINDINGS: Lower chest: Lung bases show no acute findings. Heart size normal. No pericardial or pleural effusion.  Hepatobiliary: Liver is decreased in attenuation diffusely with sparing along the gallbladder fossa. Liver and gallbladder are otherwise unremarkable. No biliary ductal dilatation.  Pancreas: Negative.  Spleen: Negative.  Adrenals/Urinary Tract: Adrenal glands and kidneys are  unremarkable. Ureters are decompressed. Bladder is unremarkable.  Stomach/Bowel: Tiny hiatal hernia. Stomach, small bowel, appendix and colon are otherwise unremarkable.  Vascular/Lymphatic: Vascular structures are unremarkable. No pathologically enlarged lymph nodes. Ileocolic mesenteric lymph nodes are subcentimeter in short axis size.  Reproductive: Prostate is normal in size.  Other: No free fluid.  Mesenteries and peritoneum are unremarkable.  Musculoskeletal: No worrisome lytic or sclerotic lesions.  IMPRESSION: 1. No acute findings. 2. Hepatic steatosis.   Electronically Signed   By: Leanna BattlesMelinda  Blietz M.D.   On: 07/26/2014 11:41   Nm Hepato W/eject Fract  07/27/2014   CLINICAL DATA:  Abdominal pain, nausea and vomiting normal HIDA scan 07/25/2014. Evaluate gallbladder function.  EXAM: NUCLEAR MEDICINE HEPATOBILIARY IMAGING WITH GALLBLADDER EF  TECHNIQUE: Sequential images of the abdomen were obtained out to 60 minutes following intravenous administration of radiopharmaceutical. After slow intravenous infusion of 1.7 micrograms Cholecystokinin, gallbladder ejection fraction was determined.  RADIOPHARMACEUTICALS:  4.9 Millicurie Tc-52110m Choletec  COMPARISON:  11/ 8/15, HIDA scan, CT scan 07/26/2014  FINDINGS: Patient was moving current exam. This may calculating ejection fraction difficult.  There is homogeneous radiotracer uptake within the liver. The gallbladder is evident by 10 min. Counts are not present within the small bowel by 60 min. Administration of CCK resulted in minimal contraction of the gallbladder. Gallbladder ejection fraction equals 36%. At 30 min, normal ejection fraction is greater than 30%.  The patient did experience symptoms during CCK infusion.  IMPRESSION: 1. Borderline normal gallbladder ejection fraction at 36%. 2. Patent cystic duct and common bile duct.   Electronically Signed   By: Genevive BiStewart  Edmunds M.D.   On: 07/27/2014 14:26   Medications: I have reviewed the patient's current  medications.  Assessment/Plan: 1) Severe RUQ pain with nausea ?Biliary dyskinesia-abnormal LFT's with 36 % EF of the gallbladder on HIDA scan. Will get a gastric emptying scan tomorrow off of all narcotics.If that is normal will consider an MRCP.  2) GERD/Hiatal hernia on PPI's.  LOS: 3 days   Tyrone Dixon 07/27/2014, 5:50 PM

## 2014-07-28 ENCOUNTER — Inpatient Hospital Stay (HOSPITAL_COMMUNITY): Payer: BC Managed Care – PPO

## 2014-07-28 LAB — COMPREHENSIVE METABOLIC PANEL
ALK PHOS: 47 U/L (ref 39–117)
ALT: 181 U/L — AB (ref 0–53)
AST: 109 U/L — ABNORMAL HIGH (ref 0–37)
Albumin: 4.1 g/dL (ref 3.5–5.2)
Anion gap: 12 (ref 5–15)
BUN: 8 mg/dL (ref 6–23)
CO2: 26 meq/L (ref 19–32)
Calcium: 9.5 mg/dL (ref 8.4–10.5)
Chloride: 103 mEq/L (ref 96–112)
Creatinine, Ser: 0.97 mg/dL (ref 0.50–1.35)
GFR calc non Af Amer: >90 mL/min (ref >90)
Glucose, Bld: 81 mg/dL (ref 70–99)
POTASSIUM: 3.8 meq/L (ref 3.7–5.3)
SODIUM: 141 meq/L (ref 137–147)
TOTAL PROTEIN: 7.4 g/dL (ref 6.0–8.3)
Total Bilirubin: 0.5 mg/dL (ref 0.3–1.2)

## 2014-07-28 LAB — LEAD, BLOOD

## 2014-07-28 LAB — CMV ANTIBODY, IGG (EIA): CMV AB - IGG: 3.3 U/mL — AB (ref <0.60)

## 2014-07-28 LAB — ALPHA-1-ANTITRYPSIN: A-1 Antitrypsin, Ser: 143 mg/dL (ref 83–199)

## 2014-07-28 LAB — CMV IGM

## 2014-07-28 LAB — CERULOPLASMIN: Ceruloplasmin: 21 mg/dL (ref 18–36)

## 2014-07-28 LAB — CORTISOL-AM, BLOOD: Cortisol - AM: 0.8 ug/dL — ABNORMAL LOW (ref 4.3–22.4)

## 2014-07-28 MED ORDER — HYDROMORPHONE HCL 1 MG/ML IJ SOLN
1.0000 mg | Freq: Once | INTRAMUSCULAR | Status: AC
  Administered 2014-07-28: 1 mg via INTRAVENOUS
  Filled 2014-07-28: qty 1

## 2014-07-28 MED ORDER — METOPROLOL TARTRATE 1 MG/ML IV SOLN
5.0000 mg | INTRAVENOUS | Status: DC | PRN
  Filled 2014-07-28: qty 5

## 2014-07-28 MED ORDER — HYDRALAZINE HCL 20 MG/ML IJ SOLN
5.0000 mg | Freq: Four times a day (QID) | INTRAMUSCULAR | Status: DC | PRN
  Administered 2014-07-28: 5 mg via INTRAVENOUS
  Filled 2014-07-28: qty 1

## 2014-07-28 MED ORDER — HYDRALAZINE HCL 10 MG PO TABS
10.0000 mg | ORAL_TABLET | Freq: Three times a day (TID) | ORAL | Status: DC
  Administered 2014-07-28 – 2014-08-02 (×13): 10 mg via ORAL
  Filled 2014-07-28 (×20): qty 1

## 2014-07-28 NOTE — Progress Notes (Signed)
Subjective: Since I last evaluated the patient, his situation has not changed much. He continues to have severe RUQ pain radiating to his right shoulder and his back. His IV has infiltrated and he is very upset crying inconsolably, holding his abdomen.   Objective: Vital signs in last 24 hours: Temp:  [97.4 F (36.3 C)-98.4 F (36.9 C)] 98.4 F (36.9 C) (11/11 1705) Pulse Rate:  [85-141] 141 (11/11 1918) Resp:  [14-20] 17 (11/11 1705) BP: (114-176)/(63-157) 114/63 mmHg (11/11 1918) SpO2:  [95 %-100 %] 100 % (11/11 1918) Last BM Date: 07/28/14  Intake/Output from previous day: 11/10 0701 - 11/11 0700 In: 935 [I.V.:935] Out: 1020 [Urine:1020] Intake/Output this shift:   General appearance: alert, cooperative, appears stated age, fatigued, flushed, severe distress and moderately obese, very anxious Resp: clear to auscultation bilaterally Cardio: regular rate and rhythm, S1, S2 normal, no murmur, click, rub or gallop GI: soft with severe RUQ and epigastric tenderness on palpation; bowel sounds normal; no masses,  no organomegaly Extremities: extremities normal, atraumatic, no cyanosis or edema  Lab Results:  Recent Labs  07/26/14 0418 07/27/14 0433  WBC 6.5 6.6  HGB 13.9 13.8  HCT 41.1 40.4  PLT 191 205   BMET  Recent Labs  07/26/14 0418 07/27/14 0433 07/28/14 0410  NA 138 140 141  K 3.8 3.8 3.8  CL 101 101 103  CO2 25 26 26   GLUCOSE 84 89 81  BUN 10 11 8   CREATININE 0.82 0.99 0.97  CALCIUM 9.6 9.5 9.5   LFT  Recent Labs  07/28/14 0410  PROT 7.4  ALBUMIN 4.1  AST 109*  ALT 181*  ALKPHOS 47  BILITOT 0.5   Studies/Results: Dg Wrist Complete Left  07/28/2014   CLINICAL DATA:  Left posterior wrist pain post fall in bathroom today  EXAM: LEFT WRIST - COMPLETE 3+ VIEW  COMPARISON:  None.  FINDINGS: Four views of the left wrist submitted. No acute fracture or subluxation. No periosteal reaction or bony erosion.  IMPRESSION: Negative.   Electronically Signed    By: Natasha MeadLiviu  Pop M.D.   On: 07/28/2014 15:37   Nm Hepato W/eject Fract  07/27/2014   CLINICAL DATA:  Abdominal pain, nausea and vomiting normal HIDA scan 07/25/2014. Evaluate gallbladder function.  EXAM: NUCLEAR MEDICINE HEPATOBILIARY IMAGING WITH GALLBLADDER EF  TECHNIQUE: Sequential images of the abdomen were obtained out to 60 minutes following intravenous administration of radiopharmaceutical. After slow intravenous infusion of 1.7 micrograms Cholecystokinin, gallbladder ejection fraction was determined.  RADIOPHARMACEUTICALS:  4.9 Millicurie Tc-722m Choletec  COMPARISON:  11/ 8/15, HIDA scan, CT scan 07/26/2014  FINDINGS: Patient was moving current exam. This may calculating ejection fraction difficult.  There is homogeneous radiotracer uptake within the liver. The gallbladder is evident by 10 min. Counts are not present within the small bowel by 60 min. Administration of CCK resulted in minimal contraction of the gallbladder. Gallbladder ejection fraction equals 36%. At 30 min, normal ejection fraction is greater than 30%.  The patient did experience symptoms during CCK infusion.  IMPRESSION: 1. Borderline normal gallbladder ejection fraction at 36%. 2. Patent cystic duct and common bile duct.   Electronically Signed   By: Genevive BiStewart  Edmunds M.D.   On: 07/27/2014 14:26   Medications: I have reviewed the patient's current medications.  Assessment/Plan: 1) RUQ/Epigastric pain with ?Biliary dyskinesia-EF of 36% on HIDA scan/Elevated transaminases. Awaiting a gastric emptying study in AM. Lipase has been normal. 2) Hepatic steatosis/Obesity.  3) ?Substance abuse . 4) ADHD/Anxiety disorder.  LOS:  4 days   Selah Zelman 07/28/2014, 7:43 PM

## 2014-07-28 NOTE — Progress Notes (Signed)
BP elevated. RR RN in to assess. MD made aware.

## 2014-07-28 NOTE — Progress Notes (Signed)
Notified Dr. Loreta AveMann that we will not be able to do Gastric Emptying Study today due to the fact the HIDA scan was less than 24hrs.    Patient fell unsupervised in bathroom. Notified Dr. Izola PriceMyers and mother. Complains of L-Wrist pain. Orders obtained.

## 2014-07-28 NOTE — Plan of Care (Signed)
Problem: Phase II Progression Outcomes Goal: Vital signs remain stable Outcome: Completed/Met Date Met:  07/28/14  Problem: Phase III Progression Outcomes Goal: Voiding independently Outcome: Completed/Met Date Met:  07/28/14

## 2014-07-28 NOTE — Progress Notes (Signed)
Patient ID: Tyrone PerkingRobert Dixon, male   DOB: 04/30/1991, 23 y.o.   MRN: [id number]  [facility] PROGRESS NOTE  Tyrone PerkingRobert Dixon [id number] DOB: 04/26/1991 DOA: 07/24/2014 PCP: No PCP Per Patient  Brief narrative: 23 y.o. male with a PMHx of ADHD, GERD, hiatal hernia, and gastritis, who presented with nausea, vomiting, abdominal pain, diarrhea. Patient reported that he started having nausea and vomiting 3 weeks ago, which have worsened. He also reported mild diarrhea, with 1-3 bowel movements with loose stools each day. He had 10/10, sharp, intermittent LUQ/epigastric pain, radiating into the RUQ and R shoulder, aggravated by laying flat or eating, and improved mildly with GI cocktail and vicodin. Prior to admission, he had a normal RUQ US and upper endoscopy by Dr. Loreta AveMann, which showed a small hiatal hernia and gastritis. He was scheduled for HIDA scan but came to the ER instead because of severity of symptoms. In the ER, AST 76, ALT 188, normal bilirubin; No leukocytosis; Lipase 31; Urinalysis negative. HIDA with ejection fraction was negative for cholecystitis but he had a borderline decreased EF. GI has been following and general surgery consulted after results of HIDA. Patient has required very high doses of narcotic to control pain and is anxious.   Assessment and Plan:   Nausea, vomiting, diarrhea and abdominal pain with mild transaminitis. Lipase and UA wnl. US demonstrated no gallstones or biliary dilation. HIDA scan was negative. Still having severe pain and requiring high doses of narcotics to control pain, clearly narcotic tolerant. Consider less common causes of transaminitis and abdominal pain, including cardiac etiologies, congenital etiologies, less common infections.  - CT abd/pelvis: No acute disease, fatty liver - Continue antiemetics and IVF - Continue dilaudid PCA - Continue Colace and miralax - NSAID allergy  - Transaminitis precludes tylenol use -  Carafate and GI cocktail - Continue BID PPI - ANA neg, EBV neg - Celiac screen (TTG, IgG, IgA): negative - HIV NR - Hepatitis panel negative - Anti-smooth muscle neg, anti-liver/kidney PENDING - Alpha-1 AT PENDING, ceruloplasmin PENDING, ferritin, Pb level, CMV IgM, TSH 2.08 & cortisol level PENDING, troponin wnl, repeat ECG stable from prior (second ECG 11/9 entered in error from another patient) - Started on fluconazole by GI for possible candidal infection  ADHD with anxiety:  - Appears anxious -Continue Adderall and Zoloft  -Added ativan prn  Transaminitis - worsening LFT's - repeat CMET in AM  Polysubstance abuse - Urine drug screen positive for barbiturates and THC  DVT prophylaxis  Heparin SQ while pt is in hospital  Code Status: Full Family Communication: Pt at bedside Disposition Plan: Home when medically stable   IV Access:   Peripheral IV Procedures and diagnostic studies:   Nm Hepato W/eject Fract 07/27/14 Borderline normal gallbladder EF at 36%. Patent cystic duct and common bile duct.   Medical Consultants:   GI Surgery Other Consultants:   None  Anti-Infectives:   None  Debbora PrestoMAGICK-Starkisha Tullis, MD  [facility] Pager [phone]  If 7PM-7AM, please contact night-coverage [email or web address] Password [email or web address] 07/28/2014, 11:34 AM   LOS: 4 days   HPI/Subjective: No events overnight.   Objective: Filed Vitals:   07/28/14 0000 07/28/14 0018 07/28/14 0146 07/28/14 0608  BP:   127/95 120/88  Pulse:   120 85  Temp:   98.3 F (36.8 C) 97.4 F (36.3 C)  TempSrc:   Oral Oral  Resp: 18 14 16 17   Height:      Weight:      SpO2: 95% 98% 100% 100%  Intake/Output Summary (Last 24 hours) at 07/28/14 1134 Last data filed at 07/28/14 1000  Gross per 24 hour  Intake    600 ml  Output    620 ml  Net    -20 ml    Exam:   General:  Pt is alert, follows commands appropriately, not in acute distress  Cardiovascular: Regular rate and rhythm, S1/S2, no  murmurs, no rubs, no gallops  Respiratory: Clear to auscultation bilaterally, no wheezing, no crackles, no rhonchi  Abdomen: Soft, tender in epigastric area, non distended, bowel sounds present, no guarding  Extremities: No edema, pulses DP and PT palpable bilaterally  Neuro: Grossly nonfocal  Data Reviewed: Basic Metabolic Panel:  Recent Labs Lab 07/24/14 2048 07/25/14 0512 07/26/14 0418 07/27/14 0433 07/28/14 0410  NA 138 138 138 140 141  K 4.5 4.2 3.8 3.8 3.8  CL 99 99 101 101 103  CO2 27 26 25 26 26   GLUCOSE 101* 89 84 89 81  BUN 12 12 10 11 8   CREATININE 0.87 0.80 0.82 0.99 0.97  CALCIUM 10.5 9.5 9.6 9.5 9.5   Liver Function Tests:  Recent Labs Lab 07/24/14 2048 07/25/14 0512 07/26/14 0418 07/27/14 0433 07/28/14 0410  AST 76* 75* 78* 67* 109*  ALT 188* 170* 166* 148* 181*  ALKPHOS 61 50 42 48 47  BILITOT 0.3 0.4 0.6 0.4 0.5  PROT 8.3 7.2 7.1 7.2 7.4  ALBUMIN 4.5 4.1 3.9 4.1 4.1    Recent Labs Lab 07/24/14 2048  LIPASE 31   CBC:  Recent Labs Lab 07/24/14 2048 07/25/14 0512 07/26/14 0418 07/27/14 0433  WBC 6.8 7.7 6.5 6.6  NEUTROABS 3.6  --   --   --   HGB 16.3 14.6 13.9 13.8  HCT 45.5 42.6 41.1 40.4  MCV 87.0 87.1 88.0 86.7  PLT 220 216 191 205   Cardiac Enzymes:  Recent Labs Lab 07/26/14 1345  TROPONINI <0.30   Scheduled Meds: . amphetamine-dextroa  20 mg Oral Daily  . docusate sodium  100 mg Oral BID  . fluconazole  IV  200 mg Intravenous Q24H  . heparin  5,000 Units Subcutaneous 3 times per day  . Dilaudid PCA   Intravenous 6 times per day  . pantoprazoleIV  40 mg Intravenous Q12H  . polyethylene glycol  17 g Oral Daily  . sertraline  25 mg Oral Daily  . sincalide  0.02 mcg/kg Intravenous Once  . sucralfate  1 g Oral TID WC & HS   Continuous Infusions: . sodium chloride 75 mL/hr at 07/28/14 [phone]

## 2014-07-29 ENCOUNTER — Inpatient Hospital Stay (HOSPITAL_COMMUNITY): Payer: BC Managed Care – PPO

## 2014-07-29 ENCOUNTER — Encounter (HOSPITAL_COMMUNITY): Payer: Self-pay | Admitting: Radiology

## 2014-07-29 LAB — COMPREHENSIVE METABOLIC PANEL
ALBUMIN: 4.4 g/dL (ref 3.5–5.2)
ALT: 166 U/L — AB (ref 0–53)
AST: 74 U/L — AB (ref 0–37)
Alkaline Phosphatase: 51 U/L (ref 39–117)
Anion gap: 16 — ABNORMAL HIGH (ref 5–15)
BILIRUBIN TOTAL: 0.8 mg/dL (ref 0.3–1.2)
BUN: 8 mg/dL (ref 6–23)
CHLORIDE: 102 meq/L (ref 96–112)
CO2: 21 mEq/L (ref 19–32)
Calcium: 10 mg/dL (ref 8.4–10.5)
Creatinine, Ser: 0.81 mg/dL (ref 0.50–1.35)
GFR calc Af Amer: >90 mL/min (ref >90)
Glucose, Bld: 92 mg/dL (ref 70–99)
Potassium: 3.4 mEq/L — ABNORMAL LOW (ref 3.7–5.3)
Sodium: 139 mEq/L (ref 137–147)
Total Protein: 8 g/dL (ref 6.0–8.3)

## 2014-07-29 LAB — CBC
HCT: 42.6 % (ref 39.0–52.0)
Hemoglobin: 14.7 g/dL (ref 13.0–17.0)
MCH: 29.9 pg (ref 26.0–34.0)
MCHC: 34.5 g/dL (ref 30.0–36.0)
MCV: 86.6 fL (ref 78.0–100.0)
PLATELETS: 221 10*3/uL (ref 150–400)
RBC: 4.92 MIL/uL (ref 4.22–5.81)
RDW: 12.6 % (ref 11.5–15.5)
WBC: 7.1 10*3/uL (ref 4.0–10.5)

## 2014-07-29 LAB — ANTI-MICROSOMAL ANTIBODY LIVER / KIDNEY: Liver-Kidney Microsomal Ab: <=20 U (ref <=20.0)

## 2014-07-29 MED ORDER — HYDROMORPHONE HCL 2 MG/ML IJ SOLN
2.0000 mg | INTRAMUSCULAR | Status: DC | PRN
Start: 2014-07-29 — End: 2014-08-03
  Administered 2014-07-29 – 2014-08-02 (×24): 2 mg via INTRAVENOUS
  Filled 2014-07-29 (×25): qty 1

## 2014-07-29 MED ORDER — HYDROMORPHONE HCL 1 MG/ML IJ SOLN
1.0000 mg | INTRAMUSCULAR | Status: DC | PRN
  Administered 2014-07-29 (×2): 1 mg via INTRAVENOUS
  Filled 2014-07-29 (×2): qty 1

## 2014-07-29 MED ORDER — OXYCODONE HCL 5 MG PO TABS: 5.0000 mg | ORAL_TABLET | ORAL | Status: DC | PRN

## 2014-07-29 MED ORDER — TECHNETIUM TC 99M SULFUR COLLOID
2.2000 | Freq: Once | INTRAVENOUS | Status: AC | PRN
  Administered 2014-07-29: 2.2 via INTRAVENOUS

## 2014-07-29 NOTE — Plan of Care (Signed)
Problem: Phase II Progression Outcomes Goal: Progress activity as tolerated unless otherwise ordered Outcome: Progressing     

## 2014-07-29 NOTE — Progress Notes (Signed)
Patient ID: Tyrone PerkingRobert Dixon, male   DOB: 03/06/1991, 23 y.o.   MRN: [id number]  [facility] PROGRESS NOTE  Tyrone PerkingRobert Homer [id number] DOB: 02/24/1991 DOA: 07/24/2014 PCP: No PCP Per Patient  Brief narrative: 23 y.o. male with a PMHx of ADHD, GERD, hiatal hernia, and gastritis, who presented with nausea, vomiting, abdominal pain, diarrhea. Patient reported that he started having nausea and vomiting 3 weeks ago, which have worsened. He also reported mild diarrhea, with 1-3 bowel movements with loose stools each day. He had 10/10, sharp, intermittent LUQ/epigastric pain, radiating into the RUQ and R shoulder, aggravated by laying flat or eating, and improved mildly with GI cocktail and vicodin. Prior to admission, he had a normal RUQ US and upper endoscopy by Dr. Loreta AveMann, which showed a small hiatal hernia and gastritis. He was scheduled for HIDA scan but came to the ER instead because of severity of symptoms. In the ER, AST 76, ALT 188, normal bilirubin; No leukocytosis; Lipase 31; Urinalysis negative. HIDA with ejection fraction was negative for cholecystitis but he had a borderline decreased EF. GI has been following and general surgery consulted after results of HIDA. Patient has required very high doses of narcotic to control pain and is anxious.   Assessment and Plan:   Nausea, vomiting, diarrhea and abdominal pain with mild transaminitis.  - Lipase and UA wnl. US demonstrated no gallstones or biliary dilation.  - HIDA scan was negative. Still having severe pain and requiring high doses of narcotics to control pain, clearly narcotic tolerant.  -CT abd/pelvis: No acute disease, fatty liver -Continue antiemetics as needed  -Continue Colace and miralax -Transaminitis precludes tylenol use, LFT's trending down  -Carafate and GI cocktail -Continue BID PPI -ANA neg, EBV neg -Celiac screen (TTG, IgG, IgA): negative -HIV NR -Hepatitis panel negative -Anti-smooth  muscle neg, anti-liver/kidney PENDING -Alpha-1 AT PENDING, ceruloplasmin PENDING, ferritin, Pb level, CMV IgM, TSH 2.08 & cortisol level PENDING, troponin wnl, repeat ECG stable from prior (second ECG 11/9 entered in error from another patient) -Started on fluconazole by GI for possible candidal infection - gastric emptying study for this AM ADHD with anxiety:  - Appears anxious -Continue Adderall and Zoloft  -Added ativan prn Transaminitis - LFT's trending down  - repeat CMET in AM Polysubstance abuse - Urine drug screen positive for barbiturates and THC  DVT prophylaxis  Heparin SQ while pt is in hospital  Code Status: Full Family Communication: Pt at bedside Disposition Plan: Home when medically stable   IV Access:    Peripheral IV Procedures and diagnostic studies:    Nm Hepato W/eject Fract 07/27/14 Borderline normal gallbladder EF at 36%. Patent cystic duct and common bile duct.  Medical Consultants:    GI  Surgery Other Consultants:    None  Anti-Infectives:    None  Debbora PrestoMAGICK-Eufelia Veno, MD  [facility] Pager [phone]  If 7PM-7AM, please contact night-coverage [email or web address] Password [email or web address] 07/29/2014, 10:36 AM   LOS: 5 days   HPI/Subjective: Minimally verbal with me this AM, flat affect, reports ongoing pain.  Objective: Filed Vitals:   07/28/14 1705 07/28/14 1918 07/28/14 2207 07/29/14 0532  BP: 158/74 114/63 135/84 131/72  Pulse: 120 141 100 83  Temp: 98.4 F (36.9 C)  98 F (36.7 C) 97.8 F (36.6 C)  TempSrc: Oral  Oral Oral  Resp: 17  18 19   Height:      Weight:      SpO2: 100% 100% 98% 99%    Intake/Output Summary (Last 24 hours) at 07/29/14  1036 Last data filed at 07/29/14 0634  Gross per 24 hour  Intake 1763.25 ml  Output   2775 ml  Net -1011.75 ml    Exam:   General:  Pt is alert, in mild distress due to pain   Cardiovascular: Regular rate and rhythm, S1/S2, no murmurs, no rubs, no gallops  Respiratory: Clear  to auscultation bilaterally, no wheezing, no crackles, no rhonchi  Abdomen: Soft, tender in epigastric area, non distended, bowel sounds present, no guarding  Data Reviewed: Basic Metabolic Panel:  Recent Labs Lab 07/25/14 0512 07/26/14 0418 07/27/14 0433 07/28/14 0410 07/29/14 0446  NA 138 138 140 141 139  K 4.2 3.8 3.8 3.8 3.4*  CL 99 101 101 103 102  CO2 26 25 26 26 21   GLUCOSE 89 84 89 81 92  BUN 12 10 11 8 8   CREATININE 0.80 0.82 0.99 0.97 0.81  CALCIUM 9.5 9.6 9.5 9.5 10.0   Liver Function Tests:  Recent Labs Lab 07/25/14 0512 07/26/14 0418 07/27/14 0433 07/28/14 0410 07/29/14 0446  AST 75* 78* 67* 109* 74*  ALT 170* 166* 148* 181* 166*  ALKPHOS 50 42 48 47 51  BILITOT 0.4 0.6 0.4 0.5 0.8  PROT 7.2 7.1 7.2 7.4 8.0  ALBUMIN 4.1 3.9 4.1 4.1 4.4    Recent Labs Lab 07/24/14 2048  LIPASE 31   No results for input(s): AMMONIA in the last 168 hours. CBC:  Recent Labs Lab 07/24/14 2048 07/25/14 0512 07/26/14 0418 07/27/14 0433 07/29/14 0446  WBC 6.8 7.7 6.5 6.6 7.1  NEUTROABS 3.6  --   --   --   --   HGB 16.3 14.6 13.9 13.8 14.7  HCT 45.5 42.6 41.1 40.4 42.6  MCV 87.0 87.1 88.0 86.7 86.6  PLT 220 216 191 205 221   Cardiac Enzymes:  Recent Labs Lab 07/26/14 1345  TROPONINI <0.30      Scheduled Meds: . amphetamine-dextroamphetamine  20 mg Oral Daily  . docusate sodium  100 mg Oral BID  . fluconazole (DIFLUCAN) IV  200 mg Intravenous Q24H  . heparin  5,000 Units Subcutaneous 3 times per day  . hydrALAZINE  10 mg Oral 3 times per day  . HYDROmorphone PCA 0.3 mg/mL   Intravenous 6 times per day  . pantoprazole (PROTONIX) IV  40 mg Intravenous Q12H  . polyethylene glycol  17 g Oral Daily  . sertraline  25 mg Oral Daily  . sincalide  0.02 mcg/kg Intravenous Once  . sucralfate  1 g Oral TID WC & HS   Continuous Infusions: . sodium chloride 75 mL (07/28/14 2200)

## 2014-07-29 NOTE — Progress Notes (Signed)
Subjective: Severe abdominal pain and persistent nausea/vomiting.  Objective: Vital signs in last 24 hours: Temp:  [97.8 F (36.6 C)-98.4 F (36.9 C)] 97.8 F (36.6 C) (11/12 1406) Pulse Rate:  [83-141] 120 (11/12 1406) Resp:  [17-19] 18 (11/12 1406) BP: (114-158)/(63-93) 129/93 mmHg (11/12 1406) SpO2:  [98 %-100 %] 100 % (11/12 1406) Last BM Date: 07/29/14  Intake/Output from previous day: 11/11 0701 - 11/12 0700 In: 1913.3 [P.O.:200; I.V.:1713.3] Out: 2775 [Urine:2775] Intake/Output this shift:    General appearance: acute distress, writhing in pain, dry heaving GI: tender in the epigastric region with radiation into his chest.  Lab Results:  Recent Labs  07/27/14 0433 07/29/14 0446  WBC 6.6 7.1  HGB 13.8 14.7  HCT 40.4 42.6  PLT 205 221   BMET  Recent Labs  07/27/14 0433 07/28/14 0410 07/29/14 0446  NA 140 141 139  K 3.8 3.8 3.4*  CL 101 103 102  CO2 26 26 21   GLUCOSE 89 81 92  BUN 11 8 8   CREATININE 0.99 0.97 0.81  CALCIUM 9.5 9.5 10.0   LFT  Recent Labs  07/29/14 0446  PROT 8.0  ALBUMIN 4.4  AST 74*  ALT 166*  ALKPHOS 51  BILITOT 0.8   PT/INR No results for input(s): LABPROT, INR in the last 72 hours. Hepatitis Panel No results for input(s): HEPBSAG, HCVAB, HEPAIGM, HEPBIGM in the last 72 hours. C-Diff No results for input(s): CDIFFTOX in the last 72 hours. Fecal Lactopherrin No results for input(s): FECLLACTOFRN in the last 72 hours.  Studies/Results: Dg Wrist Complete Left  07/28/2014   CLINICAL DATA:  Left posterior wrist pain post fall in bathroom today  EXAM: LEFT WRIST - COMPLETE 3+ VIEW  COMPARISON:  None.  FINDINGS: Four views of the left wrist submitted. No acute fracture or subluxation. No periosteal reaction or bony erosion.  IMPRESSION: Negative.   Electronically Signed   By: Natasha MeadLiviu  Pop M.D.   On: 07/28/2014 15:37   Nm Gastric Emptying  07/29/2014   CLINICAL DATA:  Abdominal pain.  EXAM: NUCLEAR MEDICINE GASTRIC EMPTYING  SCAN  TECHNIQUE: After oral ingestion of radiolabeled meal, sequential abdominal images were obtained for 120 minutes. Residual percentage of activity remaining within the stomach was calculated at 60 and 120 minutes.  RADIOPHARMACEUTICALS:  2.2 mCi Technetium 99-m labeled sulfur colloid  COMPARISON:  CT 07/26/2014.  FINDINGS: Expected location of the stomach in the left upper quadrant. Ingested meal empties the stomach gradually over the course of the study with 98 scratched% retention at 60 min and 30% retention at 120 min (normal retention less than 30% at a 120 min).  IMPRESSION: Borderline delayed gastric emptying. 30% retention at 120 min. Normal retention is less than 30% at 120 min.   Electronically Signed   By: Maisie Fushomas  Register   On: 07/29/2014 12:13    Medications:  Scheduled: . amphetamine-dextroamphetamine  20 mg Oral Daily  . docusate sodium  100 mg Oral BID  . heparin  5,000 Units Subcutaneous 3 times per day  . hydrALAZINE  10 mg Oral 3 times per day  . pantoprazole (PROTONIX) IV  40 mg Intravenous Q12H  . polyethylene glycol  17 g Oral Daily  . sertraline  25 mg Oral Daily  . sincalide  0.02 mcg/kg Intravenous Once  . sucralfate  1 g Oral TID WC & HS   Continuous: . sodium chloride 75 mL (07/28/14 2200)    Assessment/Plan: 1) Severe abdominal pain. 2) Nausea and vomiting.  I had a very long discussion with the patient and his parents.  I am unable to discern the etiology of his abdominal pain.  The pain appears to be out of proportion to his objective findings.  I reviewed all of his blood work and imaging scans.  His urine tox screen was positive for marijuana.  He admits to smoking one joint 3 weeks ago and prior to this time he was abstinent for 7-8 months.  He was in rehab as he was arrested for a DUI.  Canniboid hyperemesis syndrome is a possibility as it it present with the current findings, but his marijuana use is not heavy.  Regardless, I think it is worthwhile for  him to try taking a hot shower as this can help to ameliorate symptoms.  Two other considerations are Acute Intermittent Porphyria and Angioedema.  These two etiologies can present with severe abdominal pain and it is prudent to check for these etiologies.  The patient is adopted.  At this point I do not see any drug seeking behavior and I think it is reasonable to increase his pain medications again.  Additionally, Ativan will be beneficial, but it does not resolve his nausea and vomiting.  This was confirmed by Nursing.  His gastric emptying scan was marginal and it is not consistent with true gastroparesis.  I do not see any role with using Reglan.  Plan: 1) Check for Acute Intermittent Porphyria and Angioedema. 2) Increase pain meds. 3) Trial of a hot shower. 4) Continue supportive care.  LOS: 5 days   Tyrone Dixon D 07/29/2014, 4:16 PM

## 2014-07-30 LAB — OVA AND PARASITE EXAMINATION
Ova and parasites: NONE SEEN
Special Requests: NORMAL

## 2014-07-30 LAB — GI PATHOGEN PANEL BY PCR, STOOL
C difficile toxin A/B: POSITIVE
CAMPYLOBACTER BY PCR: NEGATIVE
Cryptosporidium by PCR: NEGATIVE
E coli (ETEC) LT/ST: NEGATIVE
E coli (STEC): NEGATIVE
E coli 0157 by PCR: NEGATIVE
G LAMBLIA BY PCR: NEGATIVE
Norovirus GI/GII: NEGATIVE
ROTAVIRUS A BY PCR: NEGATIVE
Salmonella by PCR: NEGATIVE
Shigella by PCR: NEGATIVE

## 2014-07-30 LAB — C4 COMPLEMENT: COMPLEMENT C4, BODY FLUID: 33 mg/dL (ref 10–40)

## 2014-07-30 MED ORDER — CHLORPROMAZINE HCL 25 MG PO TABS
25.0000 mg | ORAL_TABLET | Freq: Three times a day (TID) | ORAL | Status: DC | PRN
  Administered 2014-07-30: 25 mg via ORAL
  Filled 2014-07-30 (×2): qty 1

## 2014-07-30 MED ORDER — CHLORPROMAZINE HCL 25 MG/ML IJ SOLN
25.0000 mg | Freq: Three times a day (TID) | INTRAMUSCULAR | Status: DC | PRN
  Filled 2014-07-30: qty 1

## 2014-07-30 MED ORDER — OXYCODONE HCL 5 MG PO TABS
10.0000 mg | ORAL_TABLET | ORAL | Status: DC | PRN
Start: 2014-07-30 — End: 2014-07-31
  Administered 2014-07-30 – 2014-07-31 (×2): 10 mg via ORAL
  Filled 2014-07-30 (×2): qty 2

## 2014-07-30 MED ORDER — SODIUM CHLORIDE 0.45 % IV SOLN
INTRAVENOUS | Status: DC
  Administered 2014-07-30 – 2014-08-01 (×3): via INTRAVENOUS
  Filled 2014-07-30 (×8): qty 1000

## 2014-07-30 NOTE — Progress Notes (Signed)
Patient ID: Tyrone Dixon, male   DOB: 12/17/1990, 23 y.o.   MRN: [id number]  [facility] PROGRESS NOTE  Tyrone Dixon [id number] DOB: 09/30/1990 DOA: 07/24/2014 PCP: No PCP Per Patient  Brief narrative: 23 y.o. male with a PMHx of ADHD, GERD, hiatal hernia, and gastritis, who presented with nausea, vomiting, abdominal pain, diarrhea. Patient reported that he started having nausea and vomiting 3 weeks ago, which have worsened. He also reported mild diarrhea, with 1-3 bowel movements with loose stools each day. He had 10/10, sharp, intermittent LUQ/epigastric pain, radiating into the RUQ and R shoulder, aggravated by laying flat or eating, and improved mildly with GI cocktail and vicodin. Prior to admission, he had a normal RUQ US and upper endoscopy by Dr. Loreta AveMann, which showed a small hiatal hernia and gastritis. He was scheduled for HIDA scan but came to the ER instead because of severity of symptoms. In the ER, AST 76, ALT 188, normal bilirubin; No leukocytosis; Lipase 31; Urinalysis negative. HIDA with ejection fraction was negative for cholecystitis but he had a borderline decreased EF. GI has been following and general surgery consulted after results of HIDA. Patient has required very high doses of narcotic to control pain and is anxious.   Assessment and Plan:   Nausea, vomiting, diarrhea and abdominal pain with mild transaminitis.  - Lipase and UA wnl. US demonstrated no gallstones or biliary dilation.  - HIDA scan was negative. CT abd/pelvis: No acute disease, fatty liver -ANA neg, EBV neg -Celiac screen (TTG, IgG, IgA): negative -HIV NR, Hepatitis panel negative -Anti-smooth muscle neg, anti-liver/kidney PENDING - gastric emptying study fairly unremarkable - appreciate GI recommendations, will continue to follow up  Hypokalemia - supplement and repeat BMP in AM ADHD with anxiety:  -Continue Adderall and Zoloft  -Added ativan prn Transaminitis -  LFT's trending down  Polysubstance abuse - Urine drug screen positive for barbiturates and THC  DVT prophylaxis  Heparin SQ while pt is in hospital  Code Status: Full Family Communication: Pt and father at bedside Disposition Plan: Home when medically stable   IV Access:    Peripheral IV Procedures and diagnostic studies:    Nm Hepato W/eject Fract 07/27/14 Borderline normal gallbladder EF at 36%. Patent cystic duct and common bile duct.  Medical Consultants:    GI  Surgery Other Consultants:    None  Anti-Infectives:    None  Debbora PrestoMAGICK-Weslie Pretlow, MD  [facility] Pager [phone]  If 7PM-7AM, please contact night-coverage [email or web address] Password [email or web address] 07/30/2014, 1:20 PM   LOS: 6 days   HPI/Subjective: No events overnight. Still belching and reports non bloody vomiting.   Objective: Filed Vitals:   07/29/14 0532 07/29/14 1406 07/29/14 2105 07/30/14 0530  BP: 131/72 129/93 161/96 134/88  Pulse: 83 120 112 92  Temp: 97.8 F (36.6 C) 97.8 F (36.6 C) 98.2 F (36.8 C) 97.5 F (36.4 C)  TempSrc: Oral Oral Oral Oral  Resp: 19 18 18 18   Height:      Weight:      SpO2: 99% 100% 100% 96%    Intake/Output Summary (Last 24 hours) at 07/30/14 1320 Last data filed at 07/30/14 1000  Gross per 24 hour  Intake   2110 ml  Output    725 ml  Net   1385 ml    Exam:   General:  Pt is alert, follows commands appropriately, not in acute distress  Cardiovascular: Regular rate and rhythm, S1/S2, no murmurs, no rubs, no gallops  Respiratory: Clear to auscultation bilaterally,  no wheezing, no crackles, no rhonchi  Abdomen: Soft, non tender, non distended, bowel sounds present, no guarding  Data Reviewed: Basic Metabolic Panel:  Recent Labs Lab 07/25/14 0512 07/26/14 0418 07/27/14 0433 07/28/14 0410 07/29/14 0446  NA 138 138 140 141 139  K 4.2 3.8 3.8 3.8 3.4*  CL 99 101 101 103 102  CO2 26 25 26 26 21   GLUCOSE 89 84 89 81 92  BUN 12 10 11 8 8    CREATININE 0.80 0.82 0.99 0.97 0.81  CALCIUM 9.5 9.6 9.5 9.5 10.0   Liver Function Tests:  Recent Labs Lab 07/25/14 0512 07/26/14 0418 07/27/14 0433 07/28/14 0410 07/29/14 0446  AST 75* 78* 67* 109* 74*  ALT 170* 166* 148* 181* 166*  ALKPHOS 50 42 48 47 51  BILITOT 0.4 0.6 0.4 0.5 0.8  PROT 7.2 7.1 7.2 7.4 8.0  ALBUMIN 4.1 3.9 4.1 4.1 4.4    Recent Labs Lab 07/24/14 2048  LIPASE 31   CBC:  Recent Labs Lab 07/24/14 2048 07/25/14 0512 07/26/14 0418 07/27/14 0433 07/29/14 0446  WBC 6.8 7.7 6.5 6.6 7.1  NEUTROABS 3.6  --   --   --   --   HGB 16.3 14.6 13.9 13.8 14.7  HCT 45.5 42.6 41.1 40.4 42.6  MCV 87.0 87.1 88.0 86.7 86.6  PLT 220 216 191 205 221   Cardiac Enzymes:  Recent Labs Lab 07/26/14 1345  TROPONINI <0.30    Scheduled Meds: . amphetamine-dextroamphetamine  20 mg Oral Daily  . docusate sodium  100 mg Oral BID  . heparin  5,000 Units Subcutaneous 3 times per day  . hydrALAZINE  10 mg Oral 3 times per day  . pantoprazole (PROTONIX) IV  40 mg Intravenous Q12H  . polyethylene glycol  17 g Oral Daily  . sertraline  25 mg Oral Daily  . sincalide  0.02 mcg/kg Intravenous Once  . sucralfate  1 g Oral TID WC & HS   Continuous Infusions: . sodium chloride 75 mL/hr at 07/30/14 0600

## 2014-07-30 NOTE — Plan of Care (Signed)
Problem: Phase III Progression Outcomes Goal: Pain controlled on oral analgesia Outcome: Not Progressing Goal: Activity at appropriate level-compared to baseline (UP IN CHAIR FOR HEMODIALYSIS)  Outcome: Not Progressing

## 2014-07-30 NOTE — Progress Notes (Signed)
Subjective: No significant change.    Objective: Vital signs in last 24 hours: Temp:  [97.5 F (36.4 C)-98.2 F (36.8 C)] 97.5 F (36.4 C) (11/13 0530) Pulse Rate:  [92-120] 92 (11/13 0530) Resp:  [18] 18 (11/13 0530) BP: (129-161)/(88-96) 134/88 mmHg (11/13 0530) SpO2:  [96 %-100 %] 96 % (11/13 0530) Last BM Date: 07/29/14  Intake/Output from previous day: 11/12 0701 - 11/13 0700 In: 1860 [I.V.:1860] Out: 525 [Urine:225; Stool:300] Intake/Output this shift:    General appearance: uncomfortable appearing GI: epigastric tenderness  Lab Results:  Recent Labs  07/29/14 0446  WBC 7.1  HGB 14.7  HCT 42.6  PLT 221   BMET  Recent Labs  07/28/14 0410 07/29/14 0446  NA 141 139  K 3.8 3.4*  CL 103 102  CO2 26 21  GLUCOSE 81 92  BUN 8 8  CREATININE 0.97 0.81  CALCIUM 9.5 10.0   LFT  Recent Labs  07/29/14 0446  PROT 8.0  ALBUMIN 4.4  AST 74*  ALT 166*  ALKPHOS 51  BILITOT 0.8   PT/INR No results for input(s): LABPROT, INR in the last 72 hours. Hepatitis Panel No results for input(s): HEPBSAG, HCVAB, HEPAIGM, HEPBIGM in the last 72 hours. C-Diff No results for input(s): CDIFFTOX in the last 72 hours. Fecal Lactopherrin No results for input(s): FECLLACTOFRN in the last 72 hours.  Studies/Results: Dg Wrist Complete Left  07/28/2014   CLINICAL DATA:  Left posterior wrist pain post fall in bathroom today  EXAM: LEFT WRIST - COMPLETE 3+ VIEW  COMPARISON:  None.  FINDINGS: Four views of the left wrist submitted. No acute fracture or subluxation. No periosteal reaction or bony erosion.  IMPRESSION: Negative.   Electronically Signed   By: Natasha MeadLiviu  Pop M.D.   On: 07/28/2014 15:37   Nm Gastric Emptying  07/29/2014   CLINICAL DATA:  Abdominal pain.  EXAM: NUCLEAR MEDICINE GASTRIC EMPTYING SCAN  TECHNIQUE: After oral ingestion of radiolabeled meal, sequential abdominal images were obtained for 120 minutes. Residual percentage of activity remaining within the stomach  was calculated at 60 and 120 minutes.  RADIOPHARMACEUTICALS:  2.2 mCi Technetium 99-m labeled sulfur colloid  COMPARISON:  CT 07/26/2014.  FINDINGS: Expected location of the stomach in the left upper quadrant. Ingested meal empties the stomach gradually over the course of the study with 98 scratched% retention at 60 min and 30% retention at 120 min (normal retention less than 30% at a 120 min).  IMPRESSION: Borderline delayed gastric emptying. 30% retention at 120 min. Normal retention is less than 30% at 120 min.   Electronically Signed   By: Maisie Fushomas  Register   On: 07/29/2014 12:13    Medications:  Scheduled: . amphetamine-dextroamphetamine  20 mg Oral Daily  . docusate sodium  100 mg Oral BID  . heparin  5,000 Units Subcutaneous 3 times per day  . hydrALAZINE  10 mg Oral 3 times per day  . pantoprazole (PROTONIX) IV  40 mg Intravenous Q12H  . polyethylene glycol  17 g Oral Daily  . sertraline  25 mg Oral Daily  . sincalide  0.02 mcg/kg Intravenous Once  . sucralfate  1 g Oral TID WC & HS   Continuous: . sodium chloride 75 mL/hr at 07/30/14 0600    Assessment/Plan: 1) Nausea/Vomiting. 2) Upper abdominal pain.   Unfortunately there was no benefit with a hot shower, which makes Cannaboid Hyperemesis Syndrome less likely.  Increase in his pain meds has helped.  Plan: 1) Await blood and urine  results for Acute Intermittent Porphyria and Angioedema. 2) Continue with supportive care. 3) I am [facility] second [facility].   LOS: 6 days   Arika Mainer D 07/30/2014, 8:21 AM

## 2014-07-31 LAB — BASIC METABOLIC PANEL
ANION GAP: 12 (ref 5–15)
BUN: 8 mg/dL (ref 6–23)
CHLORIDE: 102 meq/L (ref 96–112)
CO2: 26 mEq/L (ref 19–32)
Calcium: 9.7 mg/dL (ref 8.4–10.5)
Creatinine, Ser: 1.04 mg/dL (ref 0.50–1.35)
Glucose, Bld: 87 mg/dL (ref 70–99)
Potassium: 4.1 mEq/L (ref 3.7–5.3)
Sodium: 140 mEq/L (ref 137–147)

## 2014-07-31 LAB — C1 ESTERASE INHIBITOR: C1INH SerPl-mCnc: 31 mg/dL (ref 21–39)

## 2014-07-31 MED ORDER — CHLORPROMAZINE HCL 50 MG PO TABS
50.0000 mg | ORAL_TABLET | Freq: Four times a day (QID) | ORAL | Status: DC | PRN
  Administered 2014-07-31 – 2014-08-02 (×4): 50 mg via ORAL
  Filled 2014-07-31 (×7): qty 1

## 2014-07-31 MED ORDER — SACCHAROMYCES BOULARDII 250 MG PO CAPS
250.0000 mg | ORAL_CAPSULE | Freq: Two times a day (BID) | ORAL | Status: DC
  Administered 2014-08-01 – 2014-08-02 (×3): 250 mg via ORAL
  Filled 2014-07-31 (×6): qty 1

## 2014-07-31 MED ORDER — OXYCODONE HCL 5 MG PO TABS
15.0000 mg | ORAL_TABLET | ORAL | Status: DC | PRN
  Administered 2014-07-31 – 2014-08-02 (×11): 15 mg via ORAL
  Filled 2014-07-31 (×13): qty 3

## 2014-07-31 MED ORDER — METRONIDAZOLE IN NACL 5-0.79 MG/ML-% IV SOLN
500.0000 mg | Freq: Three times a day (TID) | INTRAVENOUS | Status: DC
  Administered 2014-07-31 – 2014-08-02 (×7): 500 mg via INTRAVENOUS
  Filled 2014-07-31 (×8): qty 100

## 2014-07-31 MED ORDER — METRONIDAZOLE IN NACL 5-0.79 MG/ML-% IV SOLN: 500.0000 mg | Freq: Three times a day (TID) | INTRAVENOUS | Status: DC

## 2014-07-31 NOTE — Care Management Note (Signed)
    Page 1 of 1   07/31/2014     2:54:31 PM CARE MANAGEMENT NOTE 07/31/2014  Patient:  Tyrone Dixon,Tyrone Dixon   Account Number:  [id number]  Date Initiated:  07/26/2014  Documentation initiated by:  Lorenda IshiharaPEELE,SUZANNE  Subjective/Objective Assessment:   23 yo male admitted with n/v/abd pain. PTA lived at home alone.     Action/Plan:   Home when stable.   Anticipated DC Date:  08/01/2014   Anticipated DC Plan:  ACUTE TO ACUTE TRANS      DC Planning Services  CM consult      Choice offered to / List presented to:             Status of service:  Completed, signed off Medicare Important Message given?   (If response is "NO", the following Medicare IM given date fields will be blank) Date Medicare IM given:   Medicare IM given by:   Date Additional Medicare IM given:   Additional Medicare IM given by:    Discharge Disposition:    Per UR Regulation:  Reviewed for med. necessity/level of care/duration of stay  If discussed at Long Length of Stay Meetings, dates discussed:    Comments:  07/31/14 Jackelyn Illingworth RN,BSN NCM [phone] PER NSG MD PLANS TO DISCUSS CASE W/[facility] MD FOR TRANSFER TO [facility].INFORMED NURSE OF ACUTE TO ACUTE PROCESS.WILL NEED-RECEIVING MD,RECEIVING FACILITY-RM,COBRA FORM COMPLETED,FULL TRANSFER REPORT(EPIC),MEDICAL NECESSITY FORM/EMTALA(WITHIN EPIC),CALL FACILITY FLOOR TO GIVE REPORT,[facility] TEL#[phone](ACUTE TO ACUTE TRANSORTATION).I WAS ASKED TO FAXED DEMOGRAPHICS TO [facility] SINCE THIS WAS NOT A FORMER OR ACTIVE PATIENT OF [facility].FAXED W/CONFIRMATION.

## 2014-07-31 NOTE — Plan of Care (Signed)
Problem: Consults Goal: General Medical Patient Education See Patient Education Module for specific education.  Outcome: Not Progressing  Problem: Phase II Progression Outcomes Goal: Other Phase II Outcomes/Goals Outcome: Not Progressing

## 2014-07-31 NOTE — Progress Notes (Addendum)
Subjective: No significant change with pain, nausea, or vomiting.  Objective: Vital signs in last 24 hours: Temp:  [97.6 F (36.4 C)-98.8 F (37.1 C)] 97.7 F (36.5 C) (11/14 0554) Pulse Rate:  [69-85] 81 (11/14 0554) Resp:  [18] 18 (11/14 0554) BP: (112-126)/(80-104) 126/88 mmHg (11/14 0554) SpO2:  [94 %-100 %] 100 % (11/14 0554) Last BM Date: 07/30/14  Intake/Output from previous day: 11/13 0701 - 11/14 0700 In: 904.2 [P.O.:120; I.V.:784.2] Out: 1925 [Urine:1925] Intake/Output this shift:    General appearance: alert, eructation, vomiting GI: tender in the epigastric region  Lab Results:  Recent Labs  07/29/14 0446  WBC 7.1  HGB 14.7  HCT 42.6  PLT 221   BMET  Recent Labs  07/29/14 0446 07/31/14 0514  NA 139 140  K 3.4* 4.1  CL 102 102  CO2 21 26  GLUCOSE 92 87  BUN 8 8  CREATININE 0.81 1.04  CALCIUM 10.0 9.7   LFT  Recent Labs  07/29/14 0446  PROT 8.0  ALBUMIN 4.4  AST 74*  ALT 166*  ALKPHOS 51  BILITOT 0.8   PT/INR No results for input(s): LABPROT, INR in the last 72 hours. Hepatitis Panel No results for input(s): HEPBSAG, HCVAB, HEPAIGM, HEPBIGM in the last 72 hours. C-Diff No results for input(s): CDIFFTOX in the last 72 hours. Fecal Lactopherrin No results for input(s): FECLLACTOFRN in the last 72 hours.  Studies/Results: Nm Gastric Emptying  07/29/2014   CLINICAL DATA:  Abdominal pain.  EXAM: NUCLEAR MEDICINE GASTRIC EMPTYING SCAN  TECHNIQUE: After oral ingestion of radiolabeled meal, sequential abdominal images were obtained for 120 minutes. Residual percentage of activity remaining within the stomach was calculated at 60 and 120 minutes.  RADIOPHARMACEUTICALS:  2.2 mCi Technetium 99-m labeled sulfur colloid  COMPARISON:  CT 07/26/2014.  FINDINGS: Expected location of the stomach in the left upper quadrant. Ingested meal empties the stomach gradually over the course of the study with 98 scratched% retention at 60 min and 30% retention  at 120 min (normal retention less than 30% at a 120 min).  IMPRESSION: Borderline delayed gastric emptying. 30% retention at 120 min. Normal retention is less than 30% at 120 min.   Electronically Signed   By: Maisie Fushomas  Register   On: 07/29/2014 12:13    Medications:  Scheduled: . amphetamine-dextroamphetamine  20 mg Oral Daily  . docusate sodium  100 mg Oral BID  . heparin  5,000 Units Subcutaneous 3 times per day  . hydrALAZINE  10 mg Oral 3 times per day  . pantoprazole (PROTONIX) IV  40 mg Intravenous Q12H  . polyethylene glycol  17 g Oral Daily  . sertraline  25 mg Oral Daily  . sincalide  0.02 mcg/kg Intravenous Once  . sucralfate  1 g Oral TID WC & HS   Continuous: . sodium chloride 0.45 % with kcl 50 mL/hr at 07/30/14 1522    Assessment/Plan: 1) Epigastric/RUQ/Right chest pain. 2) Nausea and vomiting.   I discussed the case again with Dr. Loreta AveMann.  At this point I think it will be prudent to see [facility] of his condition.  The patient and his family [facility].  His work up for Hereditary Angioedema and Acute Intermittent Porphyria is still pending.    Plan: 1) I will call [facility] to see if they will accept him as a transfer for further evaluation. 2) Continue with the current supportive therapy.  ADDENDUM:  I just saw the result for his C. Diff.  It is positive for the toxin.  This could potentially be the source of his pain.  If it is the source, his presentation is rather atypical.  LOS: 7 days   Tyrone Dixon D 07/31/2014, 7:41 AM

## 2014-07-31 NOTE — Plan of Care (Signed)
Problem: Phase II Progression Outcomes Goal: Progress activity as tolerated unless otherwise ordered Outcome: Not Progressing     

## 2014-07-31 NOTE — Progress Notes (Signed)
Patient ID: Tyrone Dixon, male   DOB: 09/28/1990, 23 y.o.   MRN: [id number]  [facility] PROGRESS NOTE  Tyrone Dixon WGN:[id number] DOB: 05/02/1991 DOA: 07/24/2014 PCP: No PCP Per Patient   Brief narrative: 23 y.o. male with a PMHx of ADHD, GERD, hiatal hernia, and gastritis, who presented with nausea, vomiting, abdominal pain, diarrhea. Patient reported that he started having nausea and vomiting 3 weeks ago, which have worsened. He also reported mild diarrhea, with 1-3 bowel movements with loose stools each day. He had 10/10, sharp, intermittent LUQ/epigastric pain, radiating into the RUQ and R shoulder, aggravated by laying flat or eating, and improved mildly with GI cocktail and vicodin. Prior to admission, he had a normal RUQ US and upper endoscopy by Dr. Loreta AveMann, which showed a small hiatal hernia and gastritis. He was scheduled for HIDA scan but came to the ER instead because of severity of symptoms. In the ER, AST 76, ALT 188, normal bilirubin; No leukocytosis; Lipase 31; Urinalysis negative. HIDA with ejection fraction was negative for cholecystitis but he had a borderline decreased EF. GI has been following and general surgery consulted after results of HIDA. Patient has required very high doses of narcotic to control pain and is anxious.   Assessment and Plan:   Nausea, vomiting, diarrhea and abdominal pain with mild transaminitis.  - Lipase and UA wnl. US demonstrated no gallstones or biliary dilation.  - HIDA scan was negative. CT abd/pelvis: No acute disease, fatty liver, ANA neg, EBV neg -Celiac screen (TTG, IgG, IgA): negative -HIV NR, Hepatitis panel negative - gastric emptying study fairly unremarkable - appreciate GI recommendations, will continue to follow up  Hypokalemia - WNL this AM ADHD with anxiety:  -Continue Adderall and Zoloft  -Added ativan prn Transaminitis - LFT's trending down  Polysubstance abuse - Urine drug screen  positive for barbiturates and THC  DVT prophylaxis  Heparin SQ while pt is in hospital  Code Status: Full Family Communication: Pt and father at bedside Disposition Plan: remains inpatient   IV Access:    Peripheral IV Procedures and diagnostic studies:    Nm Hepato W/eject Fract 07/27/14 Borderline normal gallbladder EF at 36%. Patent cystic duct and common bile duct.  Medical Consultants:    GI  Surgery - signed off  Other Consultants:    None  Anti-Infectives:    None  Debbora PrestoMAGICK-Evett Kassa, MD  [facility] Pager [phone]  If 7PM-7AM, please contact night-coverage [email or web address] Password [email or web address] 07/31/2014, 11:13 AM   LOS: 7 days   HPI/Subjective: No events overnight. Still in pain.   Objective: Filed Vitals:   07/30/14 0530 07/30/14 1400 07/30/14 2200 07/31/14 0554  BP: 134/88 118/104 112/80 126/88  Pulse: 92 85 69 81  Temp: 97.5 F (36.4 C) 98.8 F (37.1 C) 97.6 F (36.4 C) 97.7 F (36.5 C)  TempSrc: Oral Oral Oral Oral  Resp: 18 18 18 18   Height:      Weight:      SpO2: 96% 100% 94% 100%    Intake/Output Summary (Last 24 hours) at 07/31/14 1113 Last data filed at 07/31/14 0946  Gross per 24 hour  Intake 1424.17 ml  Output   2675 ml  Net -1250.83 ml    Exam:   General:  Pt is alert, follows commands appropriately, not in acute distress  Cardiovascular: Regular rate and rhythm, S1/S2, no murmurs, no rubs, no gallops  Respiratory: Clear to auscultation bilaterally, no wheezing, no crackles, no rhonchi  Abdomen: Soft, tender in epigastric area, non  distended, bowel sounds present, no guarding  Data Reviewed: Basic Metabolic Panel:  Recent Labs Lab 07/26/14 0418 07/27/14 0433 07/28/14 0410 07/29/14 0446 07/31/14 0514  NA 138 140 141 139 140  K 3.8 3.8 3.8 3.4* 4.1  CL 101 101 103 102 102  CO2 25 26 26 21 26   GLUCOSE 84 89 81 92 87  BUN 10 11 8 8 8   CREATININE 0.82 0.99 0.97 0.81 1.04  CALCIUM 9.6 9.5 9.5 10.0 9.7    Liver Function Tests:  Recent Labs Lab 07/25/14 0512 07/26/14 0418 07/27/14 0433 07/28/14 0410 07/29/14 0446  AST 75* 78* 67* 109* 74*  ALT 170* 166* 148* 181* 166*  ALKPHOS 50 42 48 47 51  BILITOT 0.4 0.6 0.4 0.5 0.8  PROT 7.2 7.1 7.2 7.4 8.0  ALBUMIN 4.1 3.9 4.1 4.1 4.4    Recent Labs Lab 07/24/14 2048  LIPASE 31   CBC:  Recent Labs Lab 07/24/14 2048 07/25/14 0512 07/26/14 0418 07/27/14 0433 07/29/14 0446  WBC 6.8 7.7 6.5 6.6 7.1  NEUTROABS 3.6  --   --   --   --   HGB 16.3 14.6 13.9 13.8 14.7  HCT 45.5 42.6 41.1 40.4 42.6  MCV 87.0 87.1 88.0 86.7 86.6  PLT 220 216 191 205 221   Cardiac Enzymes:  Recent Labs Lab 07/26/14 1345  TROPONINI <0.30   Recent Results (from the past 240 hour(s))  Ova and parasite examination     Status: None   Collection Time: 07/29/14  1:49 PM  Result Value Ref Range Status   Specimen Description PERIRECTAL  Final   Special Requests Normal  Final   Ova and parasites   Final    NO OVA OR PARASITES SEEN Performed at [facility]    Report Status 07/30/2014 FINAL  Final     Scheduled Meds: . amphetamine-dextroamphetamine  20 mg Oral Daily  . docusate sodium  100 mg Oral BID  . heparin  5,000 Units Subcutaneous 3 times per day  . hydrALAZINE  10 mg Oral 3 times per day  . pantoprazole (PROTONIX) IV  40 mg Intravenous Q12H  . polyethylene glycol  17 g Oral Daily  . sertraline  25 mg Oral Daily  . sincalide  0.02 mcg/kg Intravenous Once  . sucralfate  1 g Oral TID WC & HS   Continuous Infusions: . sodium chloride 0.45 % with kcl 50 mL/hr at 07/30/14 1522

## 2014-08-01 ENCOUNTER — Inpatient Hospital Stay (HOSPITAL_COMMUNITY): Payer: BC Managed Care – PPO

## 2014-08-01 LAB — C1 ESTERASE INHIBITOR, FUNCTIONAL: C1INH Functional/C1INH Total MFr SerPl: 92 % (ref >=68)

## 2014-08-01 MED ORDER — SENNOSIDES-DOCUSATE SODIUM 8.6-50 MG PO TABS
1.0000 | ORAL_TABLET | Freq: Two times a day (BID) | ORAL | Status: DC
  Administered 2014-08-02: 1 via ORAL
  Filled 2014-08-01 (×3): qty 1

## 2014-08-01 NOTE — Progress Notes (Signed)
Patient ID: Tyrone PerkingRobert Hagberg, male   DOB: 04/16/1991, 23 y.o.   MRN: [id number] [facility] PROGRESS [facility]  Tyrone PerkingRobert Kerman [id number] DOB: 03/18/1991 DOA: 07/24/2014 PCP: No PCP Per Patient  Brief narrative: 23 y.o. male with a PMHx of ADHD, GERD, hiatal hernia, and gastritis, who presented with nausea, vomiting, abdominal pain, diarrhea. Patient reported that he started having nausea and vomiting 3 weeks ago, which have worsened. He also reported mild diarrhea, with 1-3 bowel movements with loose stools each day. He had 10/10, sharp, intermittent LUQ/epigastric pain, radiating into the RUQ and R shoulder, aggravated by laying flat or eating, and improved mildly with GI cocktail and vicodin. Prior to admission, he had a normal RUQ US and upper endoscopy by Dr. Loreta AveMann, which showed a small hiatal hernia and gastritis. He was scheduled for HIDA scan but came to the ER instead because of severity of symptoms. In the ER, AST 76, ALT 188, normal bilirubin; No leukocytosis; Lipase 31; Urinalysis negative. HIDA with ejection fraction was negative for cholecystitis but he had a borderline decreased EF. GI has been following and general surgery consulted after results of HIDA. Patient has required very high doses of narcotic to control pain and is anxious.   Assessment and Plan:   Nausea, vomiting, diarrhea and abdominal pain with mild transaminitis.  - stool panel positive for C. Diff, pt started on Flagyl but still with no improvement in symptoms  - pt actually denies diarrhea and is constipated and wonder if that is contributing to pain as well - continue to provide stool softeners  - appreciate GI recommendations, will continue to follow up  Hypokalemia - WNL this AM Right shoulder and chest pain - likely musculoskeletal - CXR pending this AM  ADHD with anxiety:  -Continue Adderall and Zoloft  -Added ativan prn Transaminitis - LFT's trending down  Polysubstance abuse -  Urine drug screen positive for barbiturates and THC  DVT prophylaxis  Heparin SQ while pt is in hospital  Code Status: Full Family Communication: Pt and father at bedside Disposition Plan: remains inpatient   IV Access:    Peripheral IV Procedures and diagnostic studies:    Nm Hepato W/eject Fract 07/27/14 Borderline normal gallbladder EF at 36%. Patent cystic duct and common bile duct.  Medical Consultants:    GI  Surgery - signed off  Other Consultants:    None  Anti-Infectives:    Flagyl 11/14 -->   Debbora PrestoMAGICK-MYERS, ISKRA, MD  [facility] Pager [phone]  If 7PM-7AM, please contact night-coverage [email or web address] Password [email or web address] 08/01/2014, 11:28 AM   LOS: 8 days   HPI/Subjective: No events overnight. Still with pain and belching.   Objective: Filed Vitals:   07/31/14 1551 07/31/14 2200 08/01/14 0459 08/01/14 0648  BP: 133/95 103/85  125/85  Pulse: 118 116  112  Temp: 97.5 F (36.4 C) 98.1 F (36.7 C)  98 F (36.7 C)  TempSrc: Oral Oral  Oral  Resp: 22 20  20   Height:      Weight:      SpO2: 100% 98% 97% 98%    Intake/Output Summary (Last 24 hours) at 08/01/14 1128 Last data filed at 08/01/14 0649  Gross per 24 hour  Intake  922.5 ml  Output   1850 ml  Net -927.5 ml    Exam:   General:  Pt is alert, follows commands appropriately, not in acute distress  Cardiovascular: Regular rhythm, tachycardic, S1/S2, no murmurs, no rubs, no gallops  Respiratory: Clear to auscultation bilaterally, no wheezing,  no crackles, no rhonchi  Abdomen: Soft, tender in RUQ area, non distended, bowel sounds present, no guarding  Extremities: No edema, pulses DP and PT palpable bilaterally  Neuro: Grossly nonfocal  Data Reviewed: Basic Metabolic Panel:  Recent Labs Lab 07/26/14 0418 07/27/14 0433 07/28/14 0410 07/29/14 0446 07/31/14 0514  NA 138 140 141 139 140  K 3.8 3.8 3.8 3.4* 4.1  CL 101 101 103 102 102  CO2 25 26 26 21 26   GLUCOSE  84 89 81 92 87  BUN 10 11 8 8 8   CREATININE 0.82 0.99 0.97 0.81 1.04  CALCIUM 9.6 9.5 9.5 10.0 9.7   Liver Function Tests:  Recent Labs Lab 07/26/14 0418 07/27/14 0433 07/28/14 0410 07/29/14 0446  AST 78* 67* 109* 74*  ALT 166* 148* 181* 166*  ALKPHOS 42 48 47 51  BILITOT 0.6 0.4 0.5 0.8  PROT 7.1 7.2 7.4 8.0  ALBUMIN 3.9 4.1 4.1 4.4   CBC:  Recent Labs Lab 07/26/14 0418 07/27/14 0433 07/29/14 0446  WBC 6.5 6.6 7.1  HGB 13.9 13.8 14.7  HCT 41.1 40.4 42.6  MCV 88.0 86.7 86.6  PLT 191 205 221   Cardiac Enzymes:  Recent Labs Lab 07/26/14 1345  TROPONINI <0.30   Recent Results (from the past 240 hour(s))  Ova and parasite examination     Status: None   Collection Time: 07/29/14  1:49 PM  Result Value Ref Range Status   Specimen Description PERIRECTAL  Final   Special Requests Normal  Final   Ova and parasites   Final    NO OVA OR PARASITES SEEN Performed at [facility]    Report Status 07/30/2014 FINAL  Final  Stool culture     Status: None (Preliminary result)   Collection Time: 07/29/14  6:41 PM  Result Value Ref Range Status   Specimen Description STOOL  Final   Special Requests NONE  Final   Culture   Final    NO SUSPICIOUS COLONIES, CONTINUING TO HOLD Performed at [facility]    Report Status PENDING  Incomplete     Scheduled Meds: . amphetamine-dextroamphetamine  20 mg Oral Daily  . docusate sodium  100 mg Oral BID  . heparin  5,000 Units Subcutaneous 3 times per day  . hydrALAZINE  10 mg Oral 3 times per day  . metronidazole  500 mg Intravenous Q8H  . polyethylene glycol  17 g Oral Daily  . saccharomyces boulardii  250 mg Oral BID  . sertraline  25 mg Oral Daily  . sincalide  0.02 mcg/kg Intravenous Once  . sucralfate  1 g Oral TID WC & HS   Continuous Infusions: . sodium chloride 0.45 % with kcl 50 mL/hr at 08/01/14 0149

## 2014-08-01 NOTE — Plan of Care (Signed)
Problem: Phase II Progression Outcomes Goal: Progress activity as tolerated unless otherwise ordered Outcome: Completed/Met Date Met:  08/01/14 Goal: Discharge plan established Outcome: Completed/Met Date Met:  08/01/14 Goal: IV changed to normal saline lock Outcome: Not Applicable Date Met:  32/44/01 Goal: Other Phase II Outcomes/Goals Outcome: Completed/Met Date Met:  08/01/14

## 2014-08-01 NOTE — Progress Notes (Signed)
Subjective: No improvement with metronidazole x 2 doses.  Still with vomiting and abdominal pain with chest radiation.  Objective: Vital signs in last 24 hours: Temp:  [97.5 F (36.4 C)-98.1 F (36.7 C)] 98 F (36.7 C) (11/15 0648) Pulse Rate:  [112-118] 112 (11/15 0648) Resp:  [20-22] 20 (11/15 0648) BP: (103-133)/(85-95) 125/85 mmHg (11/15 0648) SpO2:  [97 %-100 %] 98 % (11/15 0648) Last BM Date: 07/31/14  Intake/Output from previous day: 11/14 0701 - 11/15 0700 In: 1180.8 [P.O.:240; I.V.:940.8] Out: 2800 [Urine:2800] Intake/Output this shift: Total I/O In: 424.2 [I.V.:424.2] Out: 900 [Urine:900]  General appearance: alert and moderate distress GI: tender in the upper abdomen  Lab Results: No results for input(s): WBC, HGB, HCT, PLT in the last 72 hours. BMET  Recent Labs  07/31/14 0514  NA 140  K 4.1  CL 102  CO2 26  GLUCOSE 87  BUN 8  CREATININE 1.04  CALCIUM 9.7   LFT No results for input(s): PROT, ALBUMIN, AST, ALT, ALKPHOS, BILITOT, BILIDIR, IBILI in the last 72 hours. PT/INR No results for input(s): LABPROT, INR in the last 72 hours. Hepatitis Panel No results for input(s): HEPBSAG, HCVAB, HEPAIGM, HEPBIGM in the last 72 hours. C-Diff No results for input(s): CDIFFTOX in the last 72 hours. Fecal Lactopherrin No results for input(s): FECLLACTOFRN in the last 72 hours.  Studies/Results: No results found.  Medications:  Scheduled: . amphetamine-dextroamphetamine  20 mg Oral Daily  . docusate sodium  100 mg Oral BID  . heparin  5,000 Units Subcutaneous 3 times per day  . hydrALAZINE  10 mg Oral 3 times per day  . metronidazole  500 mg Intravenous Q8H  . polyethylene glycol  17 g Oral Daily  . saccharomyces boulardii  250 mg Oral BID  . sertraline  25 mg Oral Daily  . sincalide  0.02 mcg/kg Intravenous Once  . sucralfate  1 g Oral TID WC & HS   Continuous: . sodium chloride 0.45 % with kcl 50 mL/hr at 08/01/14 0149    Assessment/Plan: 1)  C. Diff. 2) Nausea/Vomiting. 3) ABM pain.   I had hoped to see an improvement in his symptoms, but it is too soon to tell.  Another day or so of metronidazole will be beneficial.  [facility] and [facility].  Both facilities were full, but the remarked that all the appropriate work up and management were being pursued.  I cancelled [facility] when I noted his C. Diff positivity.  Plan: 1) Continue with metronidazole. 2) Continue with supportive care.   LOS: 8 days   Tamim Skog D 08/01/2014, 6:50 AM

## 2014-08-02 LAB — GI PATHOGEN PANEL BY PCR, STOOL
C difficile toxin A/B: POSITIVE
CRYPTOSPORIDIUM BY PCR: NEGATIVE
Campylobacter by PCR: NEGATIVE
E coli (ETEC) LT/ST: NEGATIVE
E coli (STEC): NEGATIVE
E coli 0157 by PCR: NEGATIVE
G LAMBLIA BY PCR: NEGATIVE
Norovirus GI/GII: NEGATIVE
ROTAVIRUS A BY PCR: NEGATIVE
SALMONELLA BY PCR: NEGATIVE
Shigella by PCR: NEGATIVE

## 2014-08-02 LAB — PORPHOBILINOGEN, RANDOM URINE: QUANTITATIVE PORPHOBILINOGEN: 0 mg/g{creat} (ref <2.0)

## 2014-08-02 LAB — COMPREHENSIVE METABOLIC PANEL
ALBUMIN: 4.4 g/dL (ref 3.5–5.2)
ALK PHOS: 53 U/L (ref 39–117)
ALT: 89 U/L — ABNORMAL HIGH (ref 0–53)
AST: 47 U/L — ABNORMAL HIGH (ref 0–37)
Anion gap: 15 (ref 5–15)
BUN: 8 mg/dL (ref 6–23)
CO2: 24 meq/L (ref 19–32)
Calcium: 10 mg/dL (ref 8.4–10.5)
Chloride: 102 mEq/L (ref 96–112)
Creatinine, Ser: 0.96 mg/dL (ref 0.50–1.35)
GFR calc Af Amer: >90 mL/min (ref >90)
GFR calc non Af Amer: >90 mL/min (ref >90)
GLUCOSE: 92 mg/dL (ref 70–99)
POTASSIUM: 3.8 meq/L (ref 3.7–5.3)
Sodium: 141 mEq/L (ref 137–147)
Total Bilirubin: 0.4 mg/dL (ref 0.3–1.2)
Total Protein: 7.7 g/dL (ref 6.0–8.3)

## 2014-08-02 LAB — STOOL CULTURE

## 2014-08-02 MED ORDER — SUCRALFATE 1 GM/10ML PO SUSP: 1.0000 g | Freq: Three times a day (TID) | ORAL | Status: AC

## 2014-08-02 MED ORDER — LORAZEPAM 2 MG/ML IJ SOLN: 2.0000 mg | INTRAMUSCULAR | Status: AC | PRN

## 2014-08-02 MED ORDER — CHLORPROMAZINE HCL 50 MG PO TABS: 50.0000 mg | ORAL_TABLET | Freq: Four times a day (QID) | ORAL | Status: AC | PRN

## 2014-08-02 MED ORDER — ALBUTEROL SULFATE (2.5 MG/3ML) 0.083% IN NEBU
2.5000 mg | INHALATION_SOLUTION | Freq: Three times a day (TID) | RESPIRATORY_TRACT | Status: DC
  Administered 2014-08-02 (×2): 2.5 mg via RESPIRATORY_TRACT
  Filled 2014-08-02 (×2): qty 3

## 2014-08-02 MED ORDER — METRONIDAZOLE IN NACL 5-0.79 MG/ML-% IV SOLN: 500.0000 mg | Freq: Three times a day (TID) | INTRAVENOUS | Status: AC

## 2014-08-02 NOTE — Progress Notes (Signed)
Patient ID: Tyrone Dixon, male   DOB: 04/19/1991, 23 y.o.   MRN: [id number]  [facility] PROGRESS NOTE  Tyrone Dixon NWG:[id number] DOB: 09/07/1991 DOA: 07/24/2014 PCP: No PCP Per Patient  Brief narrative: 23 y.o. male with a PMHx of ADHD, GERD, hiatal hernia, and gastritis, who presented with nausea, vomiting, abdominal pain, diarrhea. Patient reported that he started having nausea and vomiting 3 weeks ago, which have worsened. He also reported mild diarrhea, with 1-3 bowel movements with loose stools each day. He had 10/10, sharp, intermittent LUQ/epigastric pain, radiating into the RUQ and R shoulder, aggravated by laying flat or eating, and improved mildly with GI cocktail and vicodin. Prior to admission, he had a normal RUQ US and upper endoscopy by Dr. Loreta AveMann, which showed a small hiatal hernia and gastritis. He was scheduled for HIDA scan but came to the ER instead because of severity of symptoms. In the ER, AST 76, ALT 188, normal bilirubin; No leukocytosis; Lipase 31; Urinalysis negative. HIDA with ejection fraction was negative for cholecystitis but he had a borderline decreased EF. GI has been following and general surgery consulted after results of HIDA. Patient has required very high doses of narcotic to control pain and is anxious.   Assessment and Plan:   Nausea, vomiting, diarrhea and abdominal pain with mild transaminitis.  - stool panel positive for C. Diff, pt started on Flagyl but still with no improvement in symptoms  - pt actually denies diarrhea and is constipated and wonder if that is contributing to pain as well - continue to provide stool softeners  - appreciate GI recommendations, will continue to follow up  - [facility]  Hypokalemia - WNL this AM Right shoulder and chest pain - likely musculoskeletal - CXR unremarkable  ADHD with anxiety:  - ativan prn Transaminitis - LFT's trending down, pending this  AM Polysubstance abuse - Urine drug screen positive for barbiturates and THC  DVT prophylaxis  Heparin SQ while pt is in hospital  Code Status: Full Family Communication: Pt and father at bedside Disposition Plan: remains inpatient   IV Access:    Peripheral IV Procedures and diagnostic studies:    Nm Hepato W/eject Fract 07/27/14 Borderline normal gallbladder EF at 36%. Patent cystic duct and common bile duct.  Medical Consultants:    GI  Surgery - signed off  Other Consultants:    None  Anti-Infectives:    Flagyl 11/14 -->  Debbora PrestoMAGICK-Aleja Yearwood, MD  [facility] Pager [phone]  If 7PM-7AM, please contact night-coverage [email or web address] Password [email or web address] 08/02/2014, 11:57 AM   LOS: 9 days   HPI/Subjective: No events overnight.   Objective: Filed Vitals:   08/01/14 2202 08/02/14 0226 08/02/14 0628 08/02/14 1030  BP:   120/88 119/85  Pulse: 98  72 85  Temp: 98.1 F (36.7 C)  98 F (36.7 C) 97.8 F (36.6 C)  TempSrc: Oral  Oral Oral  Resp: 21  14 16   Height:      Weight:      SpO2: 95% 98% 98% 96%    Intake/Output Summary (Last 24 hours) at 08/02/14 1157 Last data filed at 08/02/14 1047  Gross per 24 hour  Intake   1790 ml  Output   1260 ml  Net    530 ml    Exam:   General:  Pt is alert, follows commands appropriately, not in acute distress  Cardiovascular: Regular rate and rhythm, S1/S2, no murmurs, no rubs, no gallops  Respiratory: Clear  to auscultation bilaterally, no wheezing, no crackles, no rhonchi  Abdomen: Soft, non tender, non distended, bowel sounds present, no guarding  Extremities: No edema, pulses DP and PT palpable bilaterally  Neuro: Grossly nonfocal  Data Reviewed: Basic Metabolic Panel:  Recent Labs Lab 07/27/14 0433 07/28/14 0410 07/29/14 0446 07/31/14 0514  NA 140 141 139 140  K 3.8 3.8 3.4* 4.1  CL 101 103 102 102  CO2 26 26 21 26   GLUCOSE 89 81 92 87  BUN 11 8 8 8   CREATININE 0.99 0.97  0.81 1.04  CALCIUM 9.5 9.5 10.0 9.7   Liver Function Tests:  Recent Labs Lab 07/27/14 0433 07/28/14 0410 07/29/14 0446  AST 67* 109* 74*  ALT 148* 181* 166*  ALKPHOS 48 47 51  BILITOT 0.4 0.5 0.8  PROT 7.2 7.4 8.0  ALBUMIN 4.1 4.1 4.4   CBC:  Recent Labs Lab 07/27/14 0433 07/29/14 0446  WBC 6.6 7.1  HGB 13.8 14.7  HCT 40.4 42.6  MCV 86.7 86.6  PLT 205 221   Cardiac Enzymes:  Recent Labs Lab 07/26/14 1345  TROPONINI <0.30    Recent Results (from the past 240 hour(s))  Ova and parasite examination     Status: None   Collection Time: 07/29/14  1:49 PM  Result Value Ref Range Status   Specimen Description PERIRECTAL  Final   Special Requests Normal  Final   Ova and parasites   Final    NO OVA OR PARASITES SEEN Performed at [facility]    Report Status 07/30/2014 FINAL  Final  Stool culture     Status: None   Collection Time: 07/29/14  6:41 PM  Result Value Ref Range Status   Specimen Description STOOL  Final   Special Requests NONE  Final   Culture   Final    NO SALMONELLA, SHIGELLA, CAMPYLOBACTER, YERSINIA, OR E.COLI 0157:H7 ISOLATED Performed at [facility]    Report Status 08/02/2014 FINAL  Final     Scheduled Meds: . albuterol  2.5 mg Nebulization TID  . heparin  5,000 Units Subcutaneous 3 times per day  . hydrALAZINE  10 mg Oral 3 times per day  . metronidazole  500 mg Intravenous Q8H  . polyethylene glycol  17 g Oral Daily  . senna-docusate  1 tablet Oral BID  . sucralfate  1 g Oral TID WC & HS   Continuous Infusions: . sodium chloride 0.45 % with kcl 50 mL/hr at 08/01/14 2237

## 2014-08-02 NOTE — Progress Notes (Signed)
Report to S. Carlynn PurlPerez, RN given.  [facility] called. Awaiting transport.   Sherron MondayGood, Avynn Klassen L

## 2014-08-02 NOTE — Progress Notes (Signed)
[facility] with [facility].  Sherron MondayGood, Lucielle Vokes L

## 2014-08-02 NOTE — Discharge Summary (Signed)
Physician Discharge Summary  Tyrone Dixon AVW:[id number] DOB: [birth year] DOA: 07/24/2014  PCP: No PCP Per Patient  Admit date: 07/24/2014 Discharge date: 08/02/2014  Recommendations for Outpatient Follow-up:  Pt to be transferred to [facility]   Discharge Diagnoses:  Principal Problem:   Nausea & vomiting Active Problems:   ADHD (attention deficit hyperactivity disorder)   Hiatal hernia   Transaminitis   RUQ abdominal pain  Discharge Condition: Stable  Diet recommendation: as tolerated   Brief narrative: 23 y.o. male with a PMHx of ADHD, GERD, hiatal hernia, and gastritis, who presented with nausea, vomiting, abdominal pain, diarrhea. Patient reported that he started having nausea and vomiting 3 weeks ago, which have worsened. He also reported mild diarrhea, with 1-3 bowel movements with loose stools each day. He had 10/10, sharp, intermittent LUQ/epigastric pain, radiating into the RUQ and R shoulder, aggravated by laying flat or eating, and improved mildly with GI cocktail and vicodin. Prior to admission, he had a normal RUQ [place] and upper endoscopy by Dr. Loreta Ave, which showed a small hiatal hernia and gastritis. He was scheduled for HIDA scan but came to the ER instead because of severity of symptoms. In the ER, AST 76, ALT 188, normal bilirubin; No leukocytosis; Lipase 31; Urinalysis negative. HIDA with ejection fraction was negative for cholecystitis but he had a borderline decreased EF. GI has been following and general surgery consulted after results of HIDA. Patient has required very high doses of narcotic to control pain and is anxious.   Assessment and Plan:   Nausea, vomiting, diarrhea and abdominal pain with mild transaminitis.  - stool panel positive for C. Diff, pt started on Flagyl but still with no improvement in symptoms  - pt actually denies diarrhea and is constipated and wonder if that is contributing to pain as well - continue to provide stool softeners   - appreciate GI recommendations, will continue to follow up  - plan to transfer to [facility], work in progress  Hypokalemia - WNL this AM Right shoulder and chest pain - likely musculoskeletal - CXR unremarkable  ADHD with anxiety:  - ativan prn Transaminitis - LFT's trending down, pending this AM Polysubstance abuse - Urine drug screen positive for barbiturates and THC  DVT prophylaxis  Heparin SQ while pt is in hospital  Code Status: Full Family Communication: Pt and father at bedside Disposition Plan: remains inpatient   IV Access:    Peripheral IV Procedures and diagnostic studies:    Nm Hepato W/eject Fract 07/27/14 Borderline normal gallbladder EF at 36%. Patent cystic duct and common bile duct.  Medical Consultants:    GI  Surgery - signed off  Other Consultants:    None  Anti-Infectives:    Flagyl 11/14 -->  Discharge Exam: Filed Vitals:   08/02/14 1400  BP: 148/83  Pulse: 97  Temp: 98.3 F (36.8 C)  Resp: 16   Filed Vitals:   08/02/14 0226 08/02/14 0628 08/02/14 1030 08/02/14 1400  BP:  120/88 119/85 148/83  Pulse:  72 85 97  Temp:  98 F (36.7 C) 97.8 F (36.6 C) 98.3 F (36.8 C)  TempSrc:  Oral Oral Oral  Resp:  14 16 16   Height:      Weight:      SpO2: 98% 98% 96% 99%    General: Pt is alert, follows commands appropriately, not in acute distress Cardiovascular: Regular rate and rhythm, S1/S2 +, no murmurs, no rubs, no gallops Respiratory: Clear to auscultation bilaterally, no wheezing, no crackles, no  rhonchi Abdominal: Soft, non tender, non distended, bowel sounds +, no guarding Extremities: no edema, no cyanosis, pulses palpable bilaterally DP and PT Neuro: Grossly nonfocal  Discharge Instructions  Discharge Instructions    Diet - low sodium heart healthy    Complete by:  As directed      Increase activity slowly    Complete by:  As directed             Medication List    STOP  taking these medications        amphetamine-dextroamphetamine 20 MG 24 hr capsule  Commonly known as:  ADDERALL XR     amphetamine-dextroamphetamine 20 MG tablet  Commonly known as:  ADDERALL     Dexlansoprazole 30 MG capsule     gi cocktail Susp suspension     omeprazole 20 MG capsule  Commonly known as:  PRILOSEC     pantoprazole 40 MG tablet  Commonly known as:  PROTONIX     sertraline 50 MG tablet  Commonly known as:  ZOLOFT      TAKE these medications        albuterol 108 (90 BASE) MCG/ACT inhaler  Commonly known as:  PROVENTIL HFA;VENTOLIN HFA  Inhale 2 puffs into the lungs every 6 (six) hours as needed for wheezing or shortness of breath.     chlorproMAZINE 50 MG tablet  Commonly known as:  THORAZINE  Take 1 tablet (50 mg total) by mouth 4 (four) times daily as needed for hiccoughs.     HYDROcodone-acetaminophen 5-325 MG per tablet  Commonly known as:  NORCO/VICODIN  Take 2 tablets by mouth every 6 (six) hours as needed for moderate pain.     LORazepam 2 MG/ML injection  Commonly known as:  ATIVAN  Inject 1 mL (2 mg total) into the vein every 4 (four) hours as needed for anxiety.     metroNIDAZOLE 5-0.79 MG/ML-% IVPB  Commonly known as:  FLAGYL  Inject 100 mLs (500 mg total) into the vein every 8 (eight) hours.     ondansetron 4 MG tablet  Commonly known as:  ZOFRAN  Take 1 tablet (4 mg total) by mouth every 6 (six) hours.     promethazine 12.5 MG suppository  Commonly known as:  PHENERGAN  Place 12.5 mg rectally every 6 (six) hours as needed for nausea or vomiting (nausea).     sucralfate 1 GM/10ML suspension  Commonly known as:  CARAFATE  Take 10 mLs (1 g total) by mouth 4 (four) times daily -  with meals and at bedtime.          The results of significant diagnostics from this hospitalization (including imaging, microbiology, ancillary and laboratory) are listed below for reference.     Microbiology: Recent Results (from the past 240 hour(s))   Ova and parasite examination     Status: None   Collection Time: 07/29/14  1:49 PM  Result Value Ref Range Status   Specimen Description PERIRECTAL  Final   Special Requests Normal  Final   Ova and parasites   Final    NO OVA OR PARASITES SEEN Performed at [facility]    Report Status 07/30/2014 FINAL  Final  Stool culture     Status: None   Collection Time: 07/29/14  6:41 PM  Result Value Ref Range Status   Specimen Description STOOL  Final   Special Requests NONE  Final   Culture   Final    NO SALMONELLA, SHIGELLA, CAMPYLOBACTER, YERSINIA,  OR E.COLI 0157:H7 ISOLATED Performed at [facility]    Report Status 08/02/2014 FINAL  Final     Labs: Basic Metabolic Panel:  Recent Labs Lab 07/27/14 0433 07/28/14 0410 07/29/14 0446 07/31/14 0514 08/02/14 1150  NA 140 141 139 140 141  K 3.8 3.8 3.4* 4.1 3.8  CL 101 103 102 102 102  CO2 26 26 21 26 24   GLUCOSE 89 81 92 87 92  BUN 11 8 8 8 8   CREATININE 0.99 0.97 0.81 1.04 0.96  CALCIUM 9.5 9.5 10.0 9.7 10.0   Liver Function Tests:  Recent Labs Lab 07/27/14 0433 07/28/14 0410 07/29/14 0446 08/02/14 1150  AST 67* 109* 74* 47*  ALT 148* 181* 166* 89*  ALKPHOS 48 47 51 53  BILITOT 0.4 0.5 0.8 0.4  PROT 7.2 7.4 8.0 7.7  ALBUMIN 4.1 4.1 4.4 4.4   No results for input(s): LIPASE, AMYLASE in the last 168 hours. No results for input(s): AMMONIA in the last 168 hours. CBC:  Recent Labs Lab 07/27/14 0433 07/29/14 0446  WBC 6.6 7.1  HGB 13.8 14.7  HCT 40.4 42.6  MCV 86.7 86.6  PLT 205 221   Cardiac Enzymes: No results for input(s): CKTOTAL, CKMB, CKMBINDEX, TROPONINI in the last 168 hours. BNP: BNP (last 3 results) No results for input(s): PROBNP in the last 8760 hours. CBG: No results for input(s): GLUCAP in the last 168 hours.   SIGNED: Time coordinating discharge: Over 30 minutes  Debbora PrestoMAGICK-Kahleb Mcclane, MD  [facility] 08/02/2014, 6:29 PM Pager [phone]  If 7PM-7AM, please  contact night-coverage [email or web address] Password [email or web address]

## 2014-08-02 NOTE — Progress Notes (Signed)
[facility]  [facility] confirmed.  Will call report shortly.

## 2014-08-02 NOTE — Progress Notes (Signed)
IV saline locked. D/C papers given and signed.  All questions related to transfer answered.  Pt stable. [facility] on site, ready to transfer pt.  All forms [facility] needed were given.

## 2014-08-02 NOTE — Progress Notes (Signed)
[facility].   [phone]  [facility] so hospitalist needs to admit.. Awaiting call back from transfer team and hospitalist.   Debbora PrestoMAGICK-Vinaya Sancho, MD  [facility] Pager [phone]  If 7PM-7AM, please contact night-coverage [email or web address] Password [email or web address]

## 2014-08-03 LAB — AMINOLEVULINIC ACID, 24 HOUR
Creatinine, Urine mg/day-ALA: 1.75 g/(24.h) (ref 0.800–2.000)
DELTA ALA 24H UR: 0.04 mg/dL (ref 0.03–0.54)
TOTAL VOLUME - ALA: 2500 mL

## 2014-08-07 LAB — PORPHYRINS, FRACTIONATED, RANDOM URINE
COPROPORPHYRIN III (PORFRU): 2.8 ug/g{creat} — AB (ref 4.1–76.4)
Coproporphyrin, random ur: 7.9 mcg/g creat (ref 5.6–28.6)
Heptacarboxyporphyrin, random ur: 1.2 mcg/g creat (ref <=2.9)
TOTAL PORPHYRINS, RANDOM UR: 16 ug/g{creat} — AB (ref 23.3–132.4)
UROPORPHYRIN, RANDOM UR: 4.1 ug/g{creat} (ref 3.1–18.2)

## 2014-11-29 ENCOUNTER — Ambulatory Visit: Payer: Self-pay | Admitting: Internal Medicine

## 2014-12-23 ENCOUNTER — Encounter: Payer: Self-pay | Admitting: Interventional Cardiology

## 2014-12-23 ENCOUNTER — Ambulatory Visit (INDEPENDENT_AMBULATORY_CARE_PROVIDER_SITE_OTHER): Payer: BLUE CROSS/BLUE SHIELD | Admitting: Interventional Cardiology

## 2014-12-23 VITALS — BP 140/84 | HR 111 | Ht 64.0 in | Wt 186.8 lb

## 2014-12-23 DIAGNOSIS — I471 Supraventricular tachycardia: Secondary | ICD-10-CM | POA: Diagnosis not present

## 2014-12-23 DIAGNOSIS — R079 Chest pain, unspecified: Secondary | ICD-10-CM | POA: Diagnosis not present

## 2014-12-23 DIAGNOSIS — R000 Tachycardia, unspecified: Secondary | ICD-10-CM

## 2014-12-23 MED ORDER — CYCLOBENZAPRINE HCL 10 MG PO TABS: 10.0000 mg | ORAL_TABLET | Freq: Three times a day (TID) | ORAL | Status: AC | PRN

## 2014-12-23 MED ORDER — HYDROCODONE-ACETAMINOPHEN 5-325 MG PO TABS: 2.0000 | ORAL_TABLET | Freq: Four times a day (QID) | ORAL | Status: AC | PRN

## 2014-12-23 MED ORDER — HYDROCODONE-ACETAMINOPHEN 5-325 MG PO TABS: 2.0000 | ORAL_TABLET | Freq: Four times a day (QID) | ORAL | Status: DC | PRN

## 2014-12-23 NOTE — Patient Instructions (Signed)
Your physician recommends that you continue on your current medications as directed. Please refer to the Current Medication list given to you today.    Your physician has requested that you have an echocardiogram. Echocardiography is a painless test that uses sound waves to create images of your heart. It provides your doctor with information about the size and shape of your heart and how well your heart's chambers and valves are working. This procedure takes approximately one hour. There are no restrictions for this procedure.  FOLLOW UP WITH WITH [facility] AS NEEDED FOR ANY CONCERNS OR REOCCURING SYMPTOMS

## 2014-12-23 NOTE — Progress Notes (Signed)
Patient ID: Tyrone PerkingRobert Dixon, male   DOB: 09/13/1991, 24 y.o.   MRN: [id number]     Cardiology Office Note   Date:  12/23/2014   ID:  Tyrone Dixon, DOB 10/31/1990, MRN [id number]  PCP:  No PCP Per Patient    No chief complaint on file. chest pain   Wt Readings from Last 3 Encounters:  12/23/14 186 lb 12.8 oz (84.732 kg)  07/27/14 186 lb (84.369 kg)       History of Present Illness: Tyrone PerkingRobert Dixon is a 24 y.o. 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  No surgery was done.  He was not restricted.  He was released from the cardiologist care at age 24.    He had his gall bladder removed in 2015.  He had CDiff. [facility] was [facility]  He had recurrent CDiff. He was in the hospital.  He had back spasms followed by a trigger shot.  He started having chest pain in 12/15.  It was a shooting on the left side of his chest.  It has been intermitttent.  It will last2-20 minutes.  It felt like a stinger on his elbow.  It moved to his back.  Worse with a deep breath.  No problems when walking his dog.  He jogs and can have pain with the deep breathing.     He has had multiple chest traumas while playing lacrosse, most recently a month ago.      Past Medical History  Diagnosis Date  . Acid reflux   . ADHD (attention deficit hyperactivity disorder)   . Esophagitis   . Hiatal hernia   . Heart enlarged     Past Surgical History  Procedure Laterality Date  . Esophagogastroduodenoscopy endoscopy    . Cholecystectomy, laparoscopic       Current Outpatient Prescriptions  Medication Sig Dispense Refill  . albuterol (PROVENTIL HFA;VENTOLIN HFA) 108 (90 BASE) MCG/ACT inhaler Inhale 2 puffs into the lungs every 6 (six) hours as needed for wheezing or shortness of breath.    . chlorproMAZINE (THORAZINE) 50 MG tablet Take 1 tablet (50 mg total) by mouth 4 (four) times daily as needed for hiccoughs.    . cyclobenzaprine (FLEXERIL) 10 MG tablet Take 1  tablet (10 mg total) by mouth 3 (three) times daily as needed for muscle spasms. 30 tablet 0  . HYDROcodone-acetaminophen (NORCO/VICODIN) 5-325 MG per tablet Take 2 tablets by mouth every 6 (six) hours as needed for moderate pain. 30 tablet 0  . LORazepam (ATIVAN) 2 MG/ML injection Inject 1 mL (2 mg total) into the vein every 4 (four) hours as needed for anxiety. 1 mL 0  . meloxicam (MOBIC) 15 MG tablet Take 15 mg by mouth daily.    . metroNIDAZOLE (FLAGYL) 5-0.79 MG/ML-% IVPB Inject 100 mLs (500 mg total) into the vein every 8 (eight) hours. 100 mL   . ondansetron (ZOFRAN) 4 MG tablet Take 1 tablet (4 mg total) by mouth every 6 (six) hours. 12 tablet 0  . promethazine (PHENERGAN) 12.5 MG suppository Place 12.5 mg rectally every 6 (six) hours as needed for nausea or vomiting (nausea).    . sucralfate (CARAFATE) 1 GM/10ML suspension Take 10 mLs (1 g total) by mouth 4 (four) times daily -  with meals and at bedtime. 420 mL 0   No current facility-administered medications for this visit.    Allergies:   Shellfish allergy and Ibuprofen    Social  History:  The patient  reports that he has never smoked. He does not have any smokeless tobacco history on file. He reports that he drinks alcohol.   Family History:  The patient's family history is not on file. He was adopted.    ROS:  Please see the history of present illness.   Otherwise, review of systems are positive for chest pain and h/o enlarged heart.   All other systems are reviewed and negative.    PHYSICAL EXAM: VS:  BP 140/84 mmHg  Pulse 111  Ht  (1.626 m)  Wt 186 lb 12.8 oz (84.732 kg)  BMI 32.05 kg/m2 , BMI Body mass index is 32.05 kg/(m^2). GEN: Well nourished, well developed, in no acute distress HEENT: normal Neck: no JVD, carotid bruits, or masses Cardiac: tachycardic, Regular rhythm; no murmurs, rubs, or gallops,no edema ; left chest tender to palpation Respiratory:  clear to auscultation bilaterally, normal work of  breathing GI: soft, nontender, nondistended, + BS MS: no deformity or atrophy Skin: warm and dry, no rash Neuro:  Strength and sensation are intact Psych: euthymic mood, full affect   EKG:   The ekg ordered today demonstrates sinus tachycardia   Recent Labs: 07/26/2014: TSH 2.080 07/29/2014: Hemoglobin 14.7; Platelets 221 08/02/2014: ALT 89*; BUN 8; Creatinine 0.96; Potassium 3.8; Sodium 141   Lipid Panel No results found for: CHOL, TRIG, HDL, CHOLHDL, VLDL, LDLCALC, LDLDIRECT   Other studies Reviewed: Additional studies/ records that were reviewed today with results demonstrating: CXR in 11/15.   ASSESSMENT AND PLAN:  1. Cardiac: Doubt his pain is ischemic.  I think it is MSK in origin. Worse with palpation.  Will check echocardiogram given his higher HR and h/o enlarged heart as a child.   2. He wants refills of his Vicodin and Flexeril.  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  He has not established with a PMD here in .  No refills.      Current medicines are reviewed at length with the patient today.  The patient concerns regarding his medicines were addressed.  The following changes have been made:  No change  Labs/ tests ordered today include: echo   Orders Placed This Encounter  Procedures  . EKG 12-Lead  . 2D Echocardiogram without contrast    Recommend 150 minutes/week of aerobic exercise Low fat, low carb, high fiber diet recommended  Disposition:   FU in prn   Delorise Jackson., MD  12/23/2014 2:41 PM    [facility] [address] [place], [place], [place]  [address] Phone: [phone]; Fax: [phone]

## 2014-12-24 ENCOUNTER — Telehealth: Payer: Self-pay | Admitting: Interventional Cardiology

## 2014-12-24 NOTE — Telephone Encounter (Signed)
NEw MEssage  Pt calling about new medications. Please call back and discuss.

## 2014-12-24 NOTE — Telephone Encounter (Signed)
I left a message for the patient to call. 

## 2014-12-27 ENCOUNTER — Other Ambulatory Visit (HOSPITAL_COMMUNITY): Payer: BLUE CROSS/BLUE SHIELD

## 2014-12-27 NOTE — Telephone Encounter (Signed)
I left a message for the patient to call. 

## 2014-12-28 NOTE — Telephone Encounter (Signed)
Spoke with pt and he states that he was just calling to figure out which pharmacy medications were sent to but he had figured it out and picked them up.

## 2014-12-31 ENCOUNTER — Ambulatory Visit (HOSPITAL_COMMUNITY): Payer: BLUE CROSS/BLUE SHIELD | Attending: Internal Medicine | Admitting: Cardiology

## 2014-12-31 DIAGNOSIS — I517 Cardiomegaly: Secondary | ICD-10-CM | POA: Insufficient documentation

## 2014-12-31 DIAGNOSIS — R079 Chest pain, unspecified: Secondary | ICD-10-CM | POA: Diagnosis not present

## 2014-12-31 DIAGNOSIS — I340 Nonrheumatic mitral (valve) insufficiency: Secondary | ICD-10-CM | POA: Insufficient documentation

## 2014-12-31 NOTE — Progress Notes (Signed)
Echo performed. 

## 2015-01-13 ENCOUNTER — Telehealth: Payer: Self-pay | Admitting: Interventional Cardiology

## 2015-01-13 NOTE — Telephone Encounter (Signed)
Left message for pt to call back. Was calling to inform pt of echo results per Dr. Eldridge DaceVaranasi.   No structural cardiac abnormality that would cause his tachycardia noted.         JV

## 2015-01-14 ENCOUNTER — Telehealth: Payer: Self-pay

## 2015-01-14 NOTE — Telephone Encounter (Signed)
**Note De-Identified Samentha Perham Obfuscation** Don Perking >','<< Less Detail',event)" href="javascript:;">More Detail >>   Corky Crafts, MD   Sent: Thu January 13, 2015 1:57 PM    To: Julio Sicks, LPN; Lorelle Formosa Maycie Luera, LPN        Message             No structural cardiac abnormality that would cause his tachycardia noted.         JV            ----- Message -----     From: Pricilla Riffle, MD     Sent: 01/11/2015 10:37 PM      To: Corky Crafts, MD        Tyrone Dixon was forwarded to me by mistake I think.     ----- Message -----     From: Scarlette Ar     Sent: 01/03/2015  3:07 PM      To: Pricilla Riffle, MD        ECHO REPORT SCAN IN UNDER ORDER-LEVEL DOCUMENT            The pt is advised and he verbalized understanding. Per his request I left a copy of his Echo results and a school note at the front desk for him to pick up on Monday (5//2/16).    Tyrone Dixon  2D Echocardiogram without contrast  Order# [id number]   Reading physician: Wendall Stade, MD  Ordering physician: Corky Crafts, MD  Study date: 12/31/14         Narrative                 *[facility]*            [address]            [place], [place] [address]              [phone]  ------------------------------------------------------------------- Transthoracic Echocardiography  Patient:  Tyrone, Dixon MR #:    [id number] Study Date: 12/31/2014 Gender:   M Age:    23 Height:   162.6 cm Weight:   84.4 kg BSA:    1.98 m^2 Pt. Status: Room:  ATTENDING  Dietrich Pates, M.D. ORDERING   Dietrich Pates, M.D. REFERRING  Dietrich Pates, M.D. SONOGRAPHER Randa Evens, Will PERFORMING  [facility], Outpatient  cc:  ------------------------------------------------------------------- LV EF:  55%  ------------------------------------------------------------------- Indications:   (R07.9).  ------------------------------------------------------------------- History:  PMH: Tachycardia. Acquired from the patient and from the patient&'s chart. Chest pain. Cardiomegaly.  ------------------------------------------------------------------- Study Conclusions  - Left ventricle: The cavity size was mildly dilated. Wall thickness was normal. Systolic function was normal. The estimated ejection fraction was 55%. Wall motion was normal; there were no regional wall motion abnormalities. - Mitral valve: There was mild regurgitation. - Left atrium: The atrium was mildly dilated. - Atrial septum: No defect or patent foramen ovale was identified.  Transthoracic echocardiography. M-mode, complete 2D, spectral Doppler, and color Doppler. Birthdate: Patient birthdate: 1991/03/17. Age: Patient is 24 yr old. Sex: Gender: male. BMI: 31.9 kg/m^2. Blood pressure:   140/84 Patient status: Outpatient. Study date: Study date: 12/31/2014. Study time: 03:05 PM. Location: [facility]  -------------------------------------------------------------------  ------------------------------------------------------------------- Left ventricle: The cavity size was mildly dilated. Wall thickness was normal. Systolic function was normal. The estimated ejection fraction was 55%. Wall motion was normal; there were no regional wall motion abnormalities.  ------------------------------------------------------------------- Aortic valve:  Trileaflet; normal thickness, mildly calcified leaflets. Mobility was not **Note De-Identified  Obfuscation** restricted. Doppler: Transvalvular velocity was within the normal range. There was no stenosis. There was no regurgitation.  ------------------------------------------------------------------- Aorta: Aortic root: The aortic root was normal in  size.  ------------------------------------------------------------------- Mitral valve:  Mildly thickened leaflets . Mobility was not restricted. Doppler: Transvalvular velocity was within the normal range. There was no evidence for stenosis. There was mild regurgitation.  Peak gradient (D): 2 mm Hg.  ------------------------------------------------------------------- Left atrium: The atrium was mildly dilated.  ------------------------------------------------------------------- Atrial septum: No defect or patent foramen ovale was identified.  ------------------------------------------------------------------- Right ventricle: The cavity size was normal. Wall thickness was normal. Systolic function was normal.  ------------------------------------------------------------------- Pulmonic valve:  Doppler: Transvalvular velocity was within the normal range. There was no evidence for stenosis.  ------------------------------------------------------------------- Tricuspid valve:  Structurally normal valve.  Doppler: Transvalvular velocity was within the normal range. There was tril regurgitation.  ------------------------------------------------------------------- Pulmonary artery:  The main pulmonary artery was normal-sized. Systolic pressure was within the normal range.  ------------------------------------------------------------------- Right atrium: The atrium was normal in size.  ------------------------------------------------------------------- Pericardium: The pericardium was normal in appearance. There was no pericardial effusion.  ------------------------------------------------------------------- Systemic veins: Inferior vena cava: The vessel was normal in size.  ------------------------------------------------------------------- Measurements  Left ventricle             Value    Reference LV ID, ED, PLAX chordal    (H)    55.3 mm   43 - 52 LV ID, ES, PLAX chordal        37.2 mm   23 - 38 LV fx shortening, PLAX chordal     33  %   >=29 LV PW thickness, ED          10.2 mm   --------- IVS/LV PW ratio, ED          0.99     <=1.3 Stroke volume, 2D           66  ml   --------- Stroke volume/bsa, 2D         33  ml/m^2 --------- LV e&', lateral             10.1 cm/s  --------- LV E/e&', lateral            7.38     --------- LV e&', medial             6.63 cm/s  --------- LV E/e&', medial            11.24    --------- LV e&', average             8.37 cm/s  --------- LV E/e&', average            8.91     ---------  Ventricular septum           Value    Reference IVS thickness, ED           10.1 mm   ---------  LVOT                  Value    Reference LVOT ID, S               24  mm   --------- LVOT area               4.52 cm^2  --------- LVOT ID                24  mm   --------- LVOT peak velocity, S **Note De-Identified  Obfuscation** 77.5 cm/s  --------- LVOT mean velocity, S         50.4 cm/s  --------- LVOT VTI, S              14.6 cm   --------- LVOT peak gradient, S         2   mm Hg --------- Stroke volume (SV), LVOT DP      66  ml   --------- Stroke index (SV/bsa), LVOT DP     33.3 ml/m^2 ---------  Aorta                 Value    Reference Aortic root ID, ED           34  mm   --------- Ascending aorta ID, A-P, S       29  mm   ---------  Left atrium              Value    Reference LA ID, A-P, ES             40  mm   --------- LA ID/bsa, A-P             2.02 cm/m^2  <=2.2 LA volume, S              52  ml   --------- LA volume/bsa, S            26.2 ml/m^2 --------- LA volume, ES, 1-p A4C         55  ml   --------- LA volume/bsa, ES, 1-p A4C       27.7 ml/m^2 --------- LA volume, ES, 1-p A2C         45  ml   --------- LA volume/bsa, ES, 1-p A2C       22.7 ml/m^2 ---------  Mitral valve              Value    Reference Mitral E-wave peak velocity      74.5 cm/s  --------- Mitral A-wave peak velocity      51.3 cm/s  --------- Mitral deceleration time    (H)   257  ms   150 - 230 Mitral peak gradient, D        2   mm Hg --------- Mitral E/A ratio, peak         1.5     ---------  Pulmonary arteries           Value    Reference PA pressure, S, DP           25  mm Hg <=30  Tricuspid valve            Value    Reference Tricuspid regurg peak velocity     233  cm/s  --------- Tricuspid peak RV-RA gradient     22  mm Hg ---------  Systemic veins             Value    Reference Estimated CVP             3   mm Hg ---------  Right ventricle            Value    Reference RV pressure, S, DP           25  mm Hg <=30 RV s&', lateral, S           7.99 cm/s  ---------  Legend: (L) and (H) mark values outside specified **Note De-Identified  Obfuscation** reference range.  ------------------------------------------------------------------- Prepared and Electronically Authenticated by  Charlton Haws, M.D. 2016-04-15T15:47:48       Results     Scan on 01/03/2015 3:05 PM by Scarlette Ar : ECHO REPORTScan on 01/03/2015 3:05 PM by Scarlette Ar : ECHO REPORT       Signed     Electronically signed by Wendall Stade, MD on 12/31/14 at 1547 EDT       2D Echocardiogram without  contrast (Order [id number])  Echocardiography  Order: [id number]   Released By: Cleon Gustin  Authorizing: Pricilla Riffle, MD   Date: 12/31/2014  Department[facility]ARD[facility]ING [address]         Order Information     Order Date/Time Release Date/Time    12/23/14 01:50 PM 12/31/14 02:52 PM    Start Date/Time: 12/31/14 02:52 PM    End Date/Time: None        Order Details     Frequency Duration    None None    Priority: Routine    Order Class: Ancillary Performed        Order Questions     Question Answer    Type of Echo Complete    Where should this test be performed [facility])    Reason for exam-Echo Chest Pain 786.50 / R07.9        Order History  Outpatient    Date/Time Action Taken User    Additional Information    12/31/14 1452 Release Cleon Gustin    FromOrder:[id number]    12/31/14 1547 Result Three One Seven Interface    Final        Imaging CC Recipients         Associated Diagnoses       ICD-9-CM ICD-10-CM    Chest pain, unspecified chest pain type    786.50 R07.9        Collection Information     Collected: 12/31/2014 3:05 PM      Resulting Agency: [facility]          Order Provider Info       Office phone Pager/beeper E-mail    Ordering User Oleta Mouse, CMA -- -- --    Authorizing Provider Pricilla Riffle, MD [phone] -- [email or web address]    Attending Provider Pricilla Riffle, MD [phone] -- [email or web address]    Billing Provider Wendall Stade, MD [phone] -- [email or web address]        Order-Level Documents:       Scan on 01/03/2015 3:05 PM by Scarlette Ar : ECHO REPORTScan on 01/03/2015 3:05 PM by Scarlette Ar : ECHO REPORT       Reprint Requisition     2D ECHOCARDIOGRAM WITHOUT CONTRAST (Order #[id number]) on 12/31/14       Original Order     Ordered On     12/23/2014 1:50 PM    Ordered By: Oleta Mouse, CMA

## 2015-01-14 NOTE — Telephone Encounter (Signed)
Lm for pt to call back re: Echo

## 2015-01-17 NOTE — Telephone Encounter (Signed)
LMTCB at home # and temporary #.

## 2015-01-18 NOTE — Telephone Encounter (Signed)
Attempts with messages left for patient to return call have been made x3 different days. Will close encounter at this time and await for patient to call back.

## 2016-02-07 ENCOUNTER — Encounter (HOSPITAL_COMMUNITY): Payer: Self-pay | Admitting: Emergency Medicine

## 2016-02-07 ENCOUNTER — Emergency Department (HOSPITAL_COMMUNITY)
Admission: EM | Admit: 2016-02-07 | Discharge: 2016-02-07 | Disposition: A | Discharge status: Home / Self Care | Payer: BLUE CROSS/BLUE SHIELD | Attending: Dermatology | Admitting: Dermatology

## 2016-02-07 ENCOUNTER — Emergency Department (HOSPITAL_COMMUNITY): Payer: BLUE CROSS/BLUE SHIELD

## 2016-02-07 DIAGNOSIS — Y999 Unspecified external cause status: Secondary | ICD-10-CM | POA: Insufficient documentation

## 2016-02-07 DIAGNOSIS — Z79899 Other long term (current) drug therapy: Secondary | ICD-10-CM | POA: Insufficient documentation

## 2016-02-07 DIAGNOSIS — S99911A Unspecified injury of right ankle, initial encounter: Secondary | ICD-10-CM | POA: Diagnosis present

## 2016-02-07 DIAGNOSIS — Y939 Activity, unspecified: Secondary | ICD-10-CM | POA: Insufficient documentation

## 2016-02-07 DIAGNOSIS — Y929 Unspecified place or not applicable: Secondary | ICD-10-CM | POA: Insufficient documentation

## 2016-02-07 DIAGNOSIS — X509XXA Other and unspecified overexertion or strenuous movements or postures, initial encounter: Secondary | ICD-10-CM | POA: Diagnosis not present

## 2016-02-07 DIAGNOSIS — Z79891 Long term (current) use of opiate analgesic: Secondary | ICD-10-CM | POA: Insufficient documentation

## 2016-02-07 DIAGNOSIS — T1490XA Injury, unspecified, initial encounter: Secondary | ICD-10-CM

## 2016-02-07 DIAGNOSIS — S93401A Sprain of unspecified ligament of right ankle, initial encounter: Secondary | ICD-10-CM

## 2016-02-07 MED ORDER — TRAMADOL HCL 50 MG PO TABS
50.0000 mg | ORAL_TABLET | Freq: Once | ORAL | Status: AC
  Administered 2016-02-07: 50 mg via ORAL
  Filled 2016-02-07: qty 1

## 2016-02-07 MED ORDER — TRAMADOL HCL 50 MG PO TABS: 50.0000 mg | ORAL_TABLET | Freq: Four times a day (QID) | ORAL | Status: AC | PRN

## 2016-02-07 NOTE — Discharge Instructions (Signed)
Rest, Ice intermittently (in the first 24-48 hours), Gentle compression with an Ace wrap, and elevate (Limb above the level of the heart)  For breakthrough pain you may take Tramadol. Do not drink alcohol drive or operate heavy machinery when taking Tramadol.   Ankle Sprain An ankle sprain is an injury to the strong, fibrous tissues (ligaments) that hold the bones of your ankle joint together.  CAUSES An ankle sprain is usually caused by a fall or by twisting your ankle. Ankle sprains most commonly occur when you step on the outer edge of your foot, and your ankle turns inward. People who participate in sports are more prone to these types of injuries.  SYMPTOMS   Pain in your ankle. The pain may be present at rest or only when you are trying to stand or walk.  Swelling.  Bruising. Bruising may develop immediately or within 1 to 2 days after your injury.  Difficulty standing or walking, particularly when turning corners or changing directions. DIAGNOSIS  Your caregiver will ask you details about your injury and perform a physical exam of your ankle to determine if you have an ankle sprain. During the physical exam, your caregiver will press on and apply pressure to specific areas of your foot and ankle. Your caregiver will try to move your ankle in certain ways. An X-ray exam may be done to be sure a bone was not broken or a ligament did not separate from one of the bones in your ankle (avulsion fracture).  TREATMENT  Certain types of braces can help stabilize your ankle. Your caregiver can make a recommendation for this. Your caregiver may recommend the use of medicine for pain. If your sprain is severe, your caregiver may refer you to a surgeon who helps to restore function to parts of your skeletal system (orthopedist) or a physical therapist. HOME CARE INSTRUCTIONS   Apply ice to your injury for 1-2 days or as directed by your caregiver. Applying ice helps to reduce inflammation and  pain.  Put ice in a plastic bag.  Place a towel between your skin and the bag.  Leave the ice on for 15-20 minutes at a time, every 2 hours while you are awake.  Only take over-the-counter or prescription medicines for pain, discomfort, or fever as directed by your caregiver.  Elevate your injured ankle above the level of your heart as much as possible for 2-3 days.  If your caregiver recommends crutches, use them as instructed. Gradually put weight on the affected ankle. Continue to use crutches or a cane until you can walk without feeling pain in your ankle.  If you have a plaster splint, wear the splint as directed by your caregiver. Do not rest it on anything harder than a pillow for the first 24 hours. Do not put weight on it. Do not get it wet. You may take it off to take a shower or bath.  You may have been given an elastic bandage to wear around your ankle to provide support. If the elastic bandage is too tight (you have numbness or tingling in your foot or your foot becomes cold and blue), adjust the bandage to make it comfortable.  If you have an air splint, you may blow more air into it or let air out to make it more comfortable. You may take your splint off at night and before taking a shower or bath. Wiggle your toes in the splint several times per day to decrease swelling. SEEK  MEDICAL CARE IF:   You have rapidly increasing bruising or swelling.  Your toes feel extremely cold or you lose feeling in your foot.  Your pain is not relieved with medicine. SEEK IMMEDIATE MEDICAL CARE IF:  Your toes are numb or blue.  You have severe pain that is increasing. MAKE SURE YOU:   Understand these instructions.  Will watch your condition.  Will get help right away if you are not doing well or get worse.   This information is not intended to replace advice given to you by your health care provider. Make sure you discuss any questions you have with your health care provider.    Document Released: 09/03/2005 Document Revised: 09/24/2014 Document Reviewed: 09/15/2011 Elsevier Interactive Patient Education [employer]

## 2016-02-07 NOTE — ED Provider Notes (Signed)
CSN: [id number]     Arrival date & time 02/07/16  1342 History   By signing my name below, I, Marisue Humble, attest that this documentation has been prepared under the direction and in the presence of non-physician practitioner, Wynetta Emery, PA-C. Electronically Signed: Marisue Humble, Scribe. 02/07/2016. 3:06 PM.   Chief Complaint  Patient presents with  . Ankle Pain    r/ankle pain    The history is provided by the patient. No language interpreter was used.   HPI Comments:  Tyrone Dixon is a 25 y.o. male who presents to the Emergency Department complaining of worsening outer right ankle pain and swelling radiating down to his right toes onset yesterday. Pt rolled his right ankle while playing lacrosse yesterday. He has rolled his right ankle and fractured the ankle previously. Pt was seen at Urgent Care yesterday; they took an x-ray of the "bottom of his foot" and was told he might have a hairline fracture. He has iced the ankle and applied heat without relief. Pt states he has not taken any pain medication because he breaks out in hives when he takes Ibuprofen and has a similar reaction to Tylenol. Pt has been walking with one crutch since the incident. Denies h/o ankle surgery.  Past Medical History  Diagnosis Date  . Acid reflux   . ADHD (attention deficit hyperactivity disorder)   . Esophagitis   . Hiatal hernia   . Heart enlarged    Past Surgical History  Procedure Laterality Date  . Esophagogastroduodenoscopy endoscopy    . Cholecystectomy, laparoscopic    . Cholecystectomy     Family History  Problem Relation Age of Onset  . Adopted: Yes   Social History  Substance Use Topics  . Smoking status: Never Smoker   . Smokeless tobacco: None  . Alcohol Use: Yes     Comment: occ    Review of Systems  10 systems reviewed and all are negative for acute change except as noted in the HPI.  Allergies  Shellfish allergy; Tylenol; and Ibuprofen  Home Medications    Prior to Admission medications   Medication Sig Start Date End Date Taking? Authorizing Provider  albuterol (PROVENTIL HFA;VENTOLIN HFA) 108 (90 BASE) MCG/ACT inhaler Inhale 2 puffs into the lungs every 6 (six) hours as needed for wheezing or shortness of breath.    Historical Provider, MD  chlorproMAZINE (THORAZINE) 50 MG tablet Take 1 tablet (50 mg total) by mouth 4 (four) times daily as needed for hiccoughs. 08/02/14   Dorothea Ogle, MD  cyclobenzaprine (FLEXERIL) 10 MG tablet Take 1 tablet (10 mg total) by mouth 3 (three) times daily as needed for muscle spasms. 12/23/14   Pricilla Riffle, MD  HYDROcodone-acetaminophen (NORCO/VICODIN) 5-325 MG per tablet Take 2 tablets by mouth every 6 (six) hours as needed for moderate pain. 12/23/14   Corky Crafts, MD  LORazepam (ATIVAN) 2 MG/ML injection Inject 1 mL (2 mg total) into the vein every 4 (four) hours as needed for anxiety. 08/02/14   Dorothea Ogle, MD  meloxicam (MOBIC) 15 MG tablet Take 15 mg by mouth daily.    Historical Provider, MD  metroNIDAZOLE (FLAGYL) 5-0.79 MG/ML-% IVPB Inject 100 mLs (500 mg total) into the vein every 8 (eight) hours. 08/02/14   Dorothea Ogle, MD  ondansetron (ZOFRAN) 4 MG tablet Take 1 tablet (4 mg total) by mouth every 6 (six) hours. 07/17/14   Fayrene Helper, PA-C  promethazine (PHENERGAN) 12.5 MG suppository Place 12.5 mg  rectally every 6 (six) hours as needed for nausea or vomiting (nausea).    Historical Provider, MD  sucralfate (CARAFATE) 1 GM/10ML suspension Take 10 mLs (1 g total) by mouth 4 (four) times daily -  with meals and at bedtime. 08/02/14   Dorothea OgleIskra M Myers, MD  traMADol (ULTRAM) 50 MG tablet Take 1 tablet (50 mg total) by mouth 4 (four) times daily as needed for severe pain. 02/07/16   Raniyah Curenton, PA-C   BP 142/90 mmHg  Pulse 90  Temp(Src) 97.9 F (36.6 C) (Oral)  Resp 18  SpO2 98% Physical Exam  Constitutional: He is oriented to person, place, and time. He appears well-developed and  well-nourished. No distress.  HENT:  Head: Normocephalic.  Eyes: Conjunctivae and EOM are normal.  Cardiovascular: Normal rate.   Pulmonary/Chest: Effort normal. No stridor.  Musculoskeletal: Normal range of motion. He exhibits edema and tenderness.  Right ankle:  No deformity, no overlying skin changes, mild swelling and ecchymosis with tenderness to palpation along the inferior, lateral malleolus. No bony tenderness palpation, distally neurovascularly intact.   Neurological: He is alert and oriented to person, place, and time.  Psychiatric: He has a normal mood and affect.  Nursing note and vitals reviewed.   ED Course  Procedures    COORDINATION OF CARE:  2:07 PM Will x-ray right ankle and right foot. Will refer pt to local Orthopedic physician. Recommended ice, rest and elevation. Discussed treatment plan with pt at bedside and pt agreed to plan.  Labs Review Labs Reviewed - No data to display  Imaging Review Dg Ankle Complete Right  02/07/2016  CLINICAL DATA:  Twisting injury while playing lacrosse with lateral ankle pain, initial encounter EXAM: RIGHT ANKLE - COMPLETE 3+ VIEW COMPARISON:  None. FINDINGS: Soft tissue swelling is noted about the ankle. No acute fracture or dislocation is seen. IMPRESSION: Soft tissue swelling without acute bony abnormality. Electronically Signed   By: Alcide CleverMark  Lukens M.D.   On: 02/07/2016 15:01   Dg Foot Complete Right  02/07/2016  CLINICAL DATA:  Inversion injury while playing lacrosse, initial encounter EXAM: RIGHT FOOT COMPLETE - 3+ VIEW COMPARISON:  None. FINDINGS: There is no evidence of fracture or dislocation. There is no evidence of arthropathy or other focal bone abnormality. Soft tissues are unremarkable. IMPRESSION: No acute abnormality noted. Electronically Signed   By: Alcide CleverMark  Lukens M.D.   On: 02/07/2016 15:01   I have personally reviewed and evaluated these images and lab results as part of my medical decision-making.   EKG  Interpretation None      MDM   Final diagnoses:  Right ankle sprain, initial encounter    Filed Vitals:   02/07/16 1402 02/07/16 1531  BP: 142/90   Pulse: 98 90  Temp: 97.9 F (36.6 C)   TempSrc: Oral   Resp: 18   SpO2: 98% 98%    Medications  traMADol (ULTRAM) tablet 50 mg (50 mg Oral Given 02/07/16 1429)    Tyrone Dixon is 25 y.o. male presenting with Ear pain to right ankle after he rolled it well playing lacrosse yesterday.  Patient is ambulatory. No point tenderness or bony tenderness to palpation. Neurovascularly intact. X-rays negative. Patient will be given crutches, Aircast, and advised to maintain RICE.   Evaluation does not show pathology that would require ongoing emergent intervention or inpatient treatment. Pt is hemodynamically stable and mentating appropriately. Discussed findings and plan with patient/guardian, who agrees with care plan. All questions answered. Return precautions discussed and outpatient  follow up given.   New Prescriptions   TRAMADOL (ULTRAM) 50 MG TABLET    Take 1 tablet (50 mg total) by mouth 4 (four) times daily as needed for severe pain.    I personally performed the services described in this documentation, which was scribed in my presence. The recorded information has been reviewed and is accurate.   Wynetta Emery, PA-C 02/07/16 1535  Cathren Laine, MD 02/08/16 [phone]

## 2016-02-07 NOTE — ED Notes (Signed)
Pt reported that he "rolled" his r/ ankle while playing lacrosse yesterday. Tx at Urgent Care yesterday, ASO applied, crutches given. Pain increased, swelling increased since yesterday

## 2016-07-15 ENCOUNTER — Encounter (HOSPITAL_COMMUNITY): Payer: Self-pay | Admitting: Emergency Medicine

## 2016-07-15 ENCOUNTER — Emergency Department (HOSPITAL_COMMUNITY)
Admission: EM | Admit: 2016-07-15 | Discharge: 2016-07-15 | Disposition: A | Discharge status: Home / Self Care | Payer: BLUE CROSS/BLUE SHIELD | Attending: Emergency Medicine | Admitting: Emergency Medicine

## 2016-07-15 DIAGNOSIS — F909 Attention-deficit hyperactivity disorder, unspecified type: Secondary | ICD-10-CM | POA: Insufficient documentation

## 2016-07-15 DIAGNOSIS — G43909 Migraine, unspecified, not intractable, without status migrainosus: Secondary | ICD-10-CM | POA: Diagnosis present

## 2016-07-15 DIAGNOSIS — G43809 Other migraine, not intractable, without status migrainosus: Secondary | ICD-10-CM | POA: Diagnosis not present

## 2016-07-15 HISTORY — DX: Personal history of traumatic brain injury: Z87.820

## 2016-07-15 HISTORY — DX: Migraine, unspecified, not intractable, without status migrainosus: G43.909

## 2016-07-15 LAB — CBC WITH DIFFERENTIAL/PLATELET
Basophils Absolute: 0 10*3/uL (ref 0.0–0.1)
Basophils Relative: 0 %
EOS PCT: 3 %
Eosinophils Absolute: 0.2 10*3/uL (ref 0.0–0.7)
HCT: 41.5 % (ref 39.0–52.0)
HEMOGLOBIN: 14.7 g/dL (ref 13.0–17.0)
LYMPHS ABS: 2.8 10*3/uL (ref 0.7–4.0)
LYMPHS PCT: 35 %
MCH: 30.7 pg (ref 26.0–34.0)
MCHC: 35.4 g/dL (ref 30.0–36.0)
MCV: 86.6 fL (ref 78.0–100.0)
MONOS PCT: 6 %
Monocytes Absolute: 0.5 10*3/uL (ref 0.1–1.0)
Neutro Abs: 4.4 10*3/uL (ref 1.7–7.7)
Neutrophils Relative %: 56 %
PLATELETS: 247 10*3/uL (ref 150–400)
RBC: 4.79 MIL/uL (ref 4.22–5.81)
RDW: 12.8 % (ref 11.5–15.5)
WBC: 7.9 10*3/uL (ref 4.0–10.5)

## 2016-07-15 LAB — BASIC METABOLIC PANEL
Anion gap: 7 (ref 5–15)
BUN: 7 mg/dL (ref 6–20)
CHLORIDE: 109 mmol/L (ref 101–111)
CO2: 24 mmol/L (ref 22–32)
CREATININE: 0.77 mg/dL (ref 0.61–1.24)
Calcium: 9.7 mg/dL (ref 8.9–10.3)
GFR calc Af Amer: >60 mL/min (ref >60)
GFR calc non Af Amer: >60 mL/min (ref >60)
Glucose, Bld: 96 mg/dL (ref 65–99)
Potassium: 4 mmol/L (ref 3.5–5.1)
SODIUM: 140 mmol/L (ref 135–145)

## 2016-07-15 MED ORDER — KETOROLAC TROMETHAMINE 30 MG/ML IJ SOLN
30.0000 mg | Freq: Once | INTRAMUSCULAR | Status: AC
  Administered 2016-07-15: 30 mg via INTRAVENOUS
  Filled 2016-07-15: qty 1

## 2016-07-15 MED ORDER — PROCHLORPERAZINE EDISYLATE 5 MG/ML IJ SOLN
10.0000 mg | Freq: Once | INTRAMUSCULAR | Status: AC
  Administered 2016-07-15: 10 mg via INTRAVENOUS
  Filled 2016-07-15: qty 2

## 2016-07-15 MED ORDER — OXYCODONE HCL 5 MG PO TABS: 5.0000 mg | ORAL_TABLET | ORAL | 0 refills | Status: DC | PRN

## 2016-07-15 MED ORDER — OXYCODONE HCL 5 MG PO TABS
5.0000 mg | ORAL_TABLET | Freq: Once | ORAL | Status: AC
  Administered 2016-07-15: 5 mg via ORAL
  Filled 2016-07-15: qty 1

## 2016-07-15 NOTE — ED Notes (Signed)
EDP at bedside  

## 2016-07-15 NOTE — ED Notes (Signed)
Pt wants labs drawn when his IV is started because he doesn't want to be stuck twice.

## 2016-07-15 NOTE — Discharge Instructions (Signed)
Take your medication as prescribed as an for pain relief. I recommend continuing to take your home medications as prescribed as needed for your migraines. Continue drinking fluids at home to remain hydrated. Please follow up with a primary care provider from the Resource Guide provided below in one week if her symptoms have not improved. I recommend following up with your neurologist at your scheduled appointment next month for follow-up regarding your recurrent migraines.  Please return to the Emergency Department if symptoms worsen or new onset of fever, neck stiffness, visual changes, photophobia, abdominal pain, vomiting, urinary symptoms, numbness, tingling, weakness, seizures, syncope.
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
## 2016-07-15 NOTE — ED Triage Notes (Signed)
Pt c/o increased headache, dizziness, neck pain and intermittent nausea. Stated that CT two weeks ago confirmed recurrent concussions. currently treated by Dr. Wilhemina CashB. Kelly -neurologist, for recurrent migraines. Pin has increased over last 24 hours-Directed to be seen in the ED by Dr. Lurlean LeydenBarkasy

## 2016-08-27 ENCOUNTER — Encounter (HOSPITAL_COMMUNITY): Payer: Self-pay | Admitting: Emergency Medicine

## 2016-08-27 ENCOUNTER — Emergency Department (HOSPITAL_COMMUNITY): Payer: BLUE CROSS/BLUE SHIELD

## 2016-08-27 ENCOUNTER — Emergency Department (HOSPITAL_COMMUNITY)
Admission: EM | Admit: 2016-08-27 | Discharge: 2016-08-27 | Disposition: A | Discharge status: Home / Self Care | Payer: BLUE CROSS/BLUE SHIELD | Attending: Emergency Medicine | Admitting: Emergency Medicine

## 2016-08-27 DIAGNOSIS — Y999 Unspecified external cause status: Secondary | ICD-10-CM | POA: Insufficient documentation

## 2016-08-27 DIAGNOSIS — N50811 Right testicular pain: Secondary | ICD-10-CM | POA: Diagnosis not present

## 2016-08-27 DIAGNOSIS — X58XXXA Exposure to other specified factors, initial encounter: Secondary | ICD-10-CM | POA: Insufficient documentation

## 2016-08-27 DIAGNOSIS — Z79899 Other long term (current) drug therapy: Secondary | ICD-10-CM | POA: Insufficient documentation

## 2016-08-27 DIAGNOSIS — Y929 Unspecified place or not applicable: Secondary | ICD-10-CM | POA: Insufficient documentation

## 2016-08-27 DIAGNOSIS — N50819 Testicular pain, unspecified: Secondary | ICD-10-CM

## 2016-08-27 DIAGNOSIS — Y9367 Activity, basketball: Secondary | ICD-10-CM | POA: Insufficient documentation

## 2016-08-27 DIAGNOSIS — S3994XA Unspecified injury of external genitals, initial encounter: Secondary | ICD-10-CM

## 2016-08-27 DIAGNOSIS — R1030 Lower abdominal pain, unspecified: Secondary | ICD-10-CM | POA: Diagnosis present

## 2016-08-27 DIAGNOSIS — F909 Attention-deficit hyperactivity disorder, unspecified type: Secondary | ICD-10-CM | POA: Insufficient documentation

## 2016-08-27 LAB — URINALYSIS, ROUTINE W REFLEX MICROSCOPIC
Bilirubin Urine: NEGATIVE
Glucose, UA: NEGATIVE mg/dL
KETONES UR: NEGATIVE mg/dL
LEUKOCYTES UA: NEGATIVE
Nitrite: NEGATIVE
PROTEIN: NEGATIVE mg/dL
Specific Gravity, Urine: 1.014 (ref 1.005–1.030)
pH: 7 (ref 5.0–8.0)

## 2016-08-27 MED ORDER — HYDROMORPHONE HCL 1 MG/ML IJ SOLN
1.0000 mg | Freq: Once | INTRAMUSCULAR | Status: AC
  Administered 2016-08-27: 1 mg via INTRAVENOUS
  Filled 2016-08-27: qty 1

## 2016-08-27 MED ORDER — KETOROLAC TROMETHAMINE 30 MG/ML IJ SOLN
30.0000 mg | Freq: Once | INTRAMUSCULAR | Status: AC
  Administered 2016-08-27: 30 mg via INTRAVENOUS
  Filled 2016-08-27: qty 1

## 2016-08-27 MED ORDER — OXYCODONE-ACETAMINOPHEN 5-325 MG PO TABS
1.0000 | ORAL_TABLET | Freq: Once | ORAL | Status: AC
  Administered 2016-08-27: 1 via ORAL
  Filled 2016-08-27: qty 1

## 2016-08-27 MED ORDER — OXYCODONE HCL 5 MG PO TABS: 5.0000 mg | ORAL_TABLET | Freq: Four times a day (QID) | ORAL | 0 refills | Status: AC | PRN

## 2016-08-27 MED ORDER — NAPROXEN 500 MG PO TABS: 500.0000 mg | ORAL_TABLET | Freq: Two times a day (BID) | ORAL | 0 refills | Status: AC

## 2016-08-27 MED ORDER — MORPHINE SULFATE (PF) 4 MG/ML IV SOLN
4.0000 mg | Freq: Once | INTRAVENOUS | Status: AC
  Administered 2016-08-27: 4 mg via INTRAVENOUS
  Filled 2016-08-27: qty 1

## 2016-08-27 MED ORDER — ONDANSETRON HCL 4 MG/2ML IJ SOLN
4.0000 mg | Freq: Once | INTRAMUSCULAR | Status: AC
  Administered 2016-08-27: 4 mg via INTRAVENOUS
  Filled 2016-08-27: qty 2

## 2016-08-27 NOTE — Discharge Instructions (Signed)
There is no obvious injury to her testicle. Make sure that you are wearing good fitting and supportive underwear. If you are not improving in 2-3 days, he needs to follow up with urology. If he developed difficulty urinating or any new worsening symptoms she needs to be reevaluated.

## 2016-08-27 NOTE — ED Provider Notes (Signed)
[facility]-EMERGENCY DEPT Provider Note   CSN: [id number] Arrival date & time: 08/27/16  0117 By signing my name below, I, Tyrone Dixon, attest that this documentation has been prepared under the direction and in the presence of Shon Batonourtney F Tristyn Demarest, MD . Electronically Signed: Levon HedgerElizabeth Dixon, Scribe. 08/27/2016. 2:06 AM.   History   Chief Complaint Chief Complaint  Patient presents with  . Groin Pain    HPI Tyrone Dixon is a 25 y.o. male who presents to the Emergency Department complaining of sudden onset, worsening groin pain onset yesterday at 1 pm. Per pt, he was playing basketball when he was elbowed in his right testicle. Pt rates his pain as 10/10 in severity. He has taken tylenol with no relief of pain. Pain is exacerbated by touch. He notes associated increased swelling and difficulty urinating. Pt takes Adderall chronically for ADHD. Pt denies any other injury or associated symptoms.   The history is provided by the patient. No language interpreter was used.   Past Medical History:  Diagnosis Date  . Acid reflux   . ADHD (attention deficit hyperactivity disorder)   . Esophagitis   . Heart enlarged   . Hiatal hernia   . History of multiple concussions   . Migraines    hx concusions    Patient Active Problem List   Diagnosis Date Noted  . Intractable abdominal pain 07/25/2014  . Nausea & vomiting 07/25/2014  . Transaminitis 07/25/2014  . ADHD (attention deficit hyperactivity disorder)   . Hiatal hernia   . Nausea with vomiting   . RUQ abdominal pain     Past Surgical History:  Procedure Laterality Date  . CHOLECYSTECTOMY    . CHOLECYSTECTOMY, LAPAROSCOPIC    . ESOPHAGOGASTRODUODENOSCOPY ENDOSCOPY    . WISDOM TOOTH EXTRACTION         Home Medications    Prior to Admission medications   Medication Sig Start Date End Date Taking? Authorizing Provider  amphetamine-dextroamphetamine (ADDERALL) 30 MG tablet Take 30 mg by mouth daily.   Yes Historical  Provider, MD  butalbital-acetaminophen-caffeine (FIORICET, ESGIC) 50-325-40 MG tablet Take 1 tablet by mouth 2 (two) times daily as needed for headache.   Yes Historical Provider, MD  nortriptyline (PAMELOR) 25 MG capsule Take 25 mg by mouth at bedtime.   Yes Historical Provider, MD  chlorproMAZINE (THORAZINE) 50 MG tablet Take 1 tablet (50 mg total) by mouth 4 (four) times daily as needed for hiccoughs. Patient not taking: Reported on 07/15/2016 08/02/14   Dorothea OgleIskra M Myers, MD  cyclobenzaprine (FLEXERIL) 10 MG tablet Take 1 tablet (10 mg total) by mouth 3 (three) times daily as needed for muscle spasms. 12/23/14   Pricilla RifflePaula V Ross, MD  HYDROcodone-acetaminophen (NORCO/VICODIN) 5-325 MG per tablet Take 2 tablets by mouth every 6 (six) hours as needed for moderate pain. Patient not taking: Reported on 07/15/2016 12/23/14   Corky CraftsJayadeep S Varanasi, MD  LORazepam (ATIVAN) 2 MG/ML injection Inject 1 mL (2 mg total) into the vein every 4 (four) hours as needed for anxiety. Patient not taking: Reported on 07/15/2016 08/02/14   Dorothea OgleIskra M Myers, MD  metroNIDAZOLE (FLAGYL) 5-0.79 MG/ML-% IVPB Inject 100 mLs (500 mg total) into the vein every 8 (eight) hours. Patient not taking: Reported on 07/15/2016 08/02/14   Dorothea OgleIskra M Myers, MD  naproxen (NAPROSYN) 500 MG tablet Take 1 tablet (500 mg total) by mouth 2 (two) times daily. 08/27/16   Shon Batonourtney F Magalene Mclear, MD  ondansetron (ZOFRAN) 4 MG tablet Take 1 tablet (4 mg  total) by mouth every 6 (six) hours. Patient not taking: Reported on 07/15/2016 07/17/14   Fayrene Helper, PA-C  oxyCODONE (ROXICODONE) 5 MG immediate release tablet Take 1 tablet (5 mg total) by mouth every 6 (six) hours as needed for severe pain. 08/27/16   Shon Baton, MD  sucralfate (CARAFATE) 1 GM/10ML suspension Take 10 mLs (1 g total) by mouth 4 (four) times daily -  with meals and at bedtime. Patient not taking: Reported on 07/15/2016 08/02/14   Dorothea Ogle, MD  traMADol (ULTRAM) 50 MG tablet Take 1 tablet (50  mg total) by mouth 4 (four) times daily as needed for severe pain. Patient not taking: Reported on 07/15/2016 02/07/16   Wynetta Emery, PA-C    Family History Family History  Problem Relation Age of Onset  . Adopted: Yes    Social History Social History  Substance Use Topics  . Smoking status: Never Smoker  . Smokeless tobacco: Never Used  . Alcohol use 1.8 oz/week    3 Cans of beer per week     Comment: occ     Allergies   Shellfish allergy; Tylenol [acetaminophen]; and Ibuprofen   Review of Systems Review of Systems  Constitutional: Negative for fever.  Gastrointestinal: Negative for abdominal pain, nausea and vomiting.  Genitourinary: Positive for difficulty urinating, scrotal swelling and testicular pain.  All other systems reviewed and are negative.  Physical Exam Updated Vital Signs BP 142/88   Pulse 78   Temp 98 F (36.7 C) (Oral)   Resp 20   SpO2 99%   Physical Exam  Constitutional: He is oriented to person, place, and time. No distress.  Uncomfortable appearing, no acute distress  HENT:  Head: Normocephalic and atraumatic.  Cardiovascular: Normal rate, regular rhythm and normal heart sounds.   No murmur heard. Pulmonary/Chest: Effort normal and breath sounds normal. No respiratory distress. He has no wheezes.  Abdominal: Soft. Bowel sounds are normal. There is no tenderness. There is no rebound.  Genitourinary:  Genitourinary Comments: Circumcised penis, no significant redness or swelling of the scrotum, no crepitus, tenderness to palpation posterior right testicle, high riding when compared to the left, cremasteric reflex intact  Musculoskeletal: He exhibits no edema.  Neurological: He is alert and oriented to person, place, and time.  Skin: Skin is warm and dry.  Psychiatric: He has a normal mood and affect.  Nursing note and vitals reviewed.   ED Treatments / Results  DIAGNOSTIC STUDIES:  Oxygen Saturation is 100% on RA, normal by my  interpretation.    COORDINATION OF CARE:  1:57 AM Discussed treatment plan with pt at bedside and pt agreed to plan.  Labs (all labs ordered are listed, but only abnormal results are displayed) Labs Reviewed  URINALYSIS, ROUTINE W REFLEX MICROSCOPIC - Abnormal; Notable for the following:       Result Value   APPearance HAZY (*)    Hgb urine dipstick LARGE (*)    Bacteria, UA RARE (*)    Squamous Epithelial / LPF 0-5 (*)    All other components within normal limits    EKG  EKG Interpretation None       Radiology US Scrotum  Result Date: 08/27/2016 CLINICAL DATA:  25 year old male with right testicular pain after trauma yesterday EXAM: SCROTAL ULTRASOUND DOPPLER ULTRASOUND OF THE TESTICLES TECHNIQUE: Complete ultrasound examination of the testicles, epididymis, and other scrotal structures was performed. Color and spectral Doppler ultrasound were also utilized to evaluate blood flow to the testicles. COMPARISON:  None. FINDINGS: Right testicle Measurements: 4.8 x 2.6 x 3.1 cm. No mass or microlithiasis visualized. Left testicle Measurements: 4.8 x 2.4 x 3.0 cm. No mass or microlithiasis visualized. Right epididymis:  Normal in size and appearance. Left epididymis:  Normal in size and appearance. Hydrocele:  Trace right hydrocele. Varicocele:  None visualized. Pulsed Doppler interrogation of both testes demonstrates normal low resistance arterial and venous waveforms bilaterally. IMPRESSION: Trace right hydrocele otherwise unremarkable testicular ultrasound. Electronically Signed   By: Elgie CollardArash  Radparvar M.D.   On: 08/27/2016 04:25   [place] Art/ven Flow Abd Pelv Doppler  Result Date: 08/27/2016 CLINICAL DATA:  25 year old male with right testicular pain after trauma yesterday EXAM: SCROTAL ULTRASOUND DOPPLER ULTRASOUND OF THE TESTICLES TECHNIQUE: Complete ultrasound examination of the testicles, epididymis, and other scrotal structures was performed. Color and spectral Doppler ultrasound were  also utilized to evaluate blood flow to the testicles. COMPARISON:  None. FINDINGS: Right testicle Measurements: 4.8 x 2.6 x 3.1 cm. No mass or microlithiasis visualized. Left testicle Measurements: 4.8 x 2.4 x 3.0 cm. No mass or microlithiasis visualized. Right epididymis:  Normal in size and appearance. Left epididymis:  Normal in size and appearance. Hydrocele:  Trace right hydrocele. Varicocele:  None visualized. Pulsed Doppler interrogation of both testes demonstrates normal low resistance arterial and venous waveforms bilaterally. IMPRESSION: Trace right hydrocele otherwise unremarkable testicular ultrasound. Electronically Signed   By: Elgie CollardArash  Radparvar M.D.   On: 08/27/2016 04:25    Procedures Procedures (including critical care time)  Medications Ordered in ED Medications  morphine 4 MG/ML injection 4 mg (4 mg Intravenous Given 08/27/16 0221)  ondansetron (ZOFRAN) injection 4 mg (4 mg Intravenous Given 08/27/16 0221)  HYDROmorphone (DILAUDID) injection 1 mg (1 mg Intravenous Given 08/27/16 0322)  oxyCODONE-acetaminophen (PERCOCET/ROXICET) 5-325 MG per tablet 1 tablet (1 tablet Oral Given 08/27/16 0501)  ketorolac (TORADOL) 30 MG/ML injection 30 mg (30 mg Intravenous Given 08/27/16 0502)     Initial Impression / Assessment and Plan / ED Course  I have reviewed the triage vital signs and the nursing notes.  Pertinent labs & imaging results that were available during my care of the patient were reviewed by me and considered in my medical decision making (see chart for details).  Clinical Course as of Aug 27 658  Mon Aug 27, 2016  [id number] Discussed with Dr. Judie GrieveLomboy, urology given pain out of proportion to exam findings.  [CH]    Clinical Course User Index [CH] Shon Batonourtney F Zaheer Wageman, MD    Patient presents with testicular pain following trauma. Also reports difficulty urinating. Nontoxic on exam. Uncomfortable appearing. He has some tenderness on exam but no objective swelling or redness.  Patient given pain and nausea medication. Ultrasound obtained to evaluate for testicular flow. This is essentially normal. Patient continues to report pain pain is out of proportion to exam findings. He was able to urinate spontaneously but reports difficulty initiating and keeping a stream.  He was able to spontaneously void 400 mL. First void residual 35 mL. Discussed with urology on-call, Dr. Judie GrieveLomboy.  No additional testing indicated at this time as patient is able to spontaneously void. Recommends NSAIDs and supportive underwear. We'll discharge patient home with anti-inflammatories, short course of oxycodone, and additional supportive measures. Urology follow-up provided.  After history, exam, and medical workup I feel the patient has been appropriately medically screened and is safe for discharge home. Pertinent diagnoses were discussed with the patient. Patient was given return precautions.   Final Clinical Impressions(s) / ED  Diagnoses   Final diagnoses:  Testicle pain  Pain in testicle due to trauma    New Prescriptions New Prescriptions   NAPROXEN (NAPROSYN) 500 MG TABLET    Take 1 tablet (500 mg total) by mouth 2 (two) times daily.   OXYCODONE (ROXICODONE) 5 MG IMMEDIATE RELEASE TABLET    Take 1 tablet (5 mg total) by mouth every 6 (six) hours as needed for severe pain.   I personally performed the services described in this documentation, which was scribed in my presence. The recorded information has been reviewed and is accurate.    Shon Baton, MD 08/27/16 [phone]
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
## 2016-08-27 NOTE — ED Notes (Signed)
Ultrasound at bedside

## 2016-08-27 NOTE — ED Notes (Signed)
Pt has been told that a urine sample is needed from him, states that he can't go right now. Urinal at the bedside.

## 2018-02-05 IMAGING — DX DG ANKLE COMPLETE 3+V*R*
3 series · 3 of 3 positions shown · non-contrast
Comparison: None.

CLINICAL DATA: Twisting injury while playing lacrosse with lateral
ankle pain, initial encounter

EXAM:
RIGHT ANKLE - COMPLETE 3+ VIEW

[ankle ap]
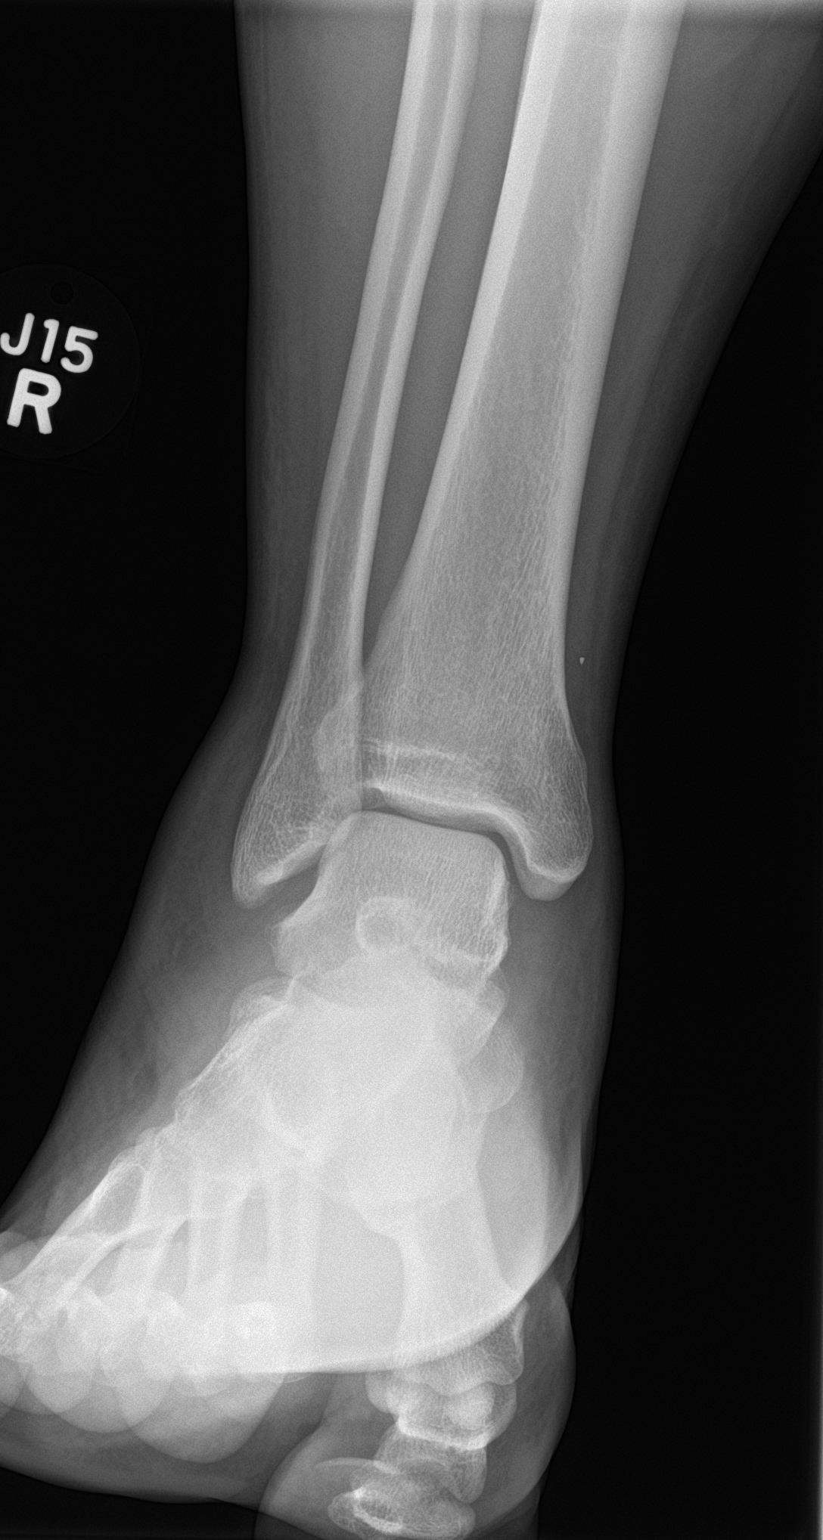

[ankle obl]
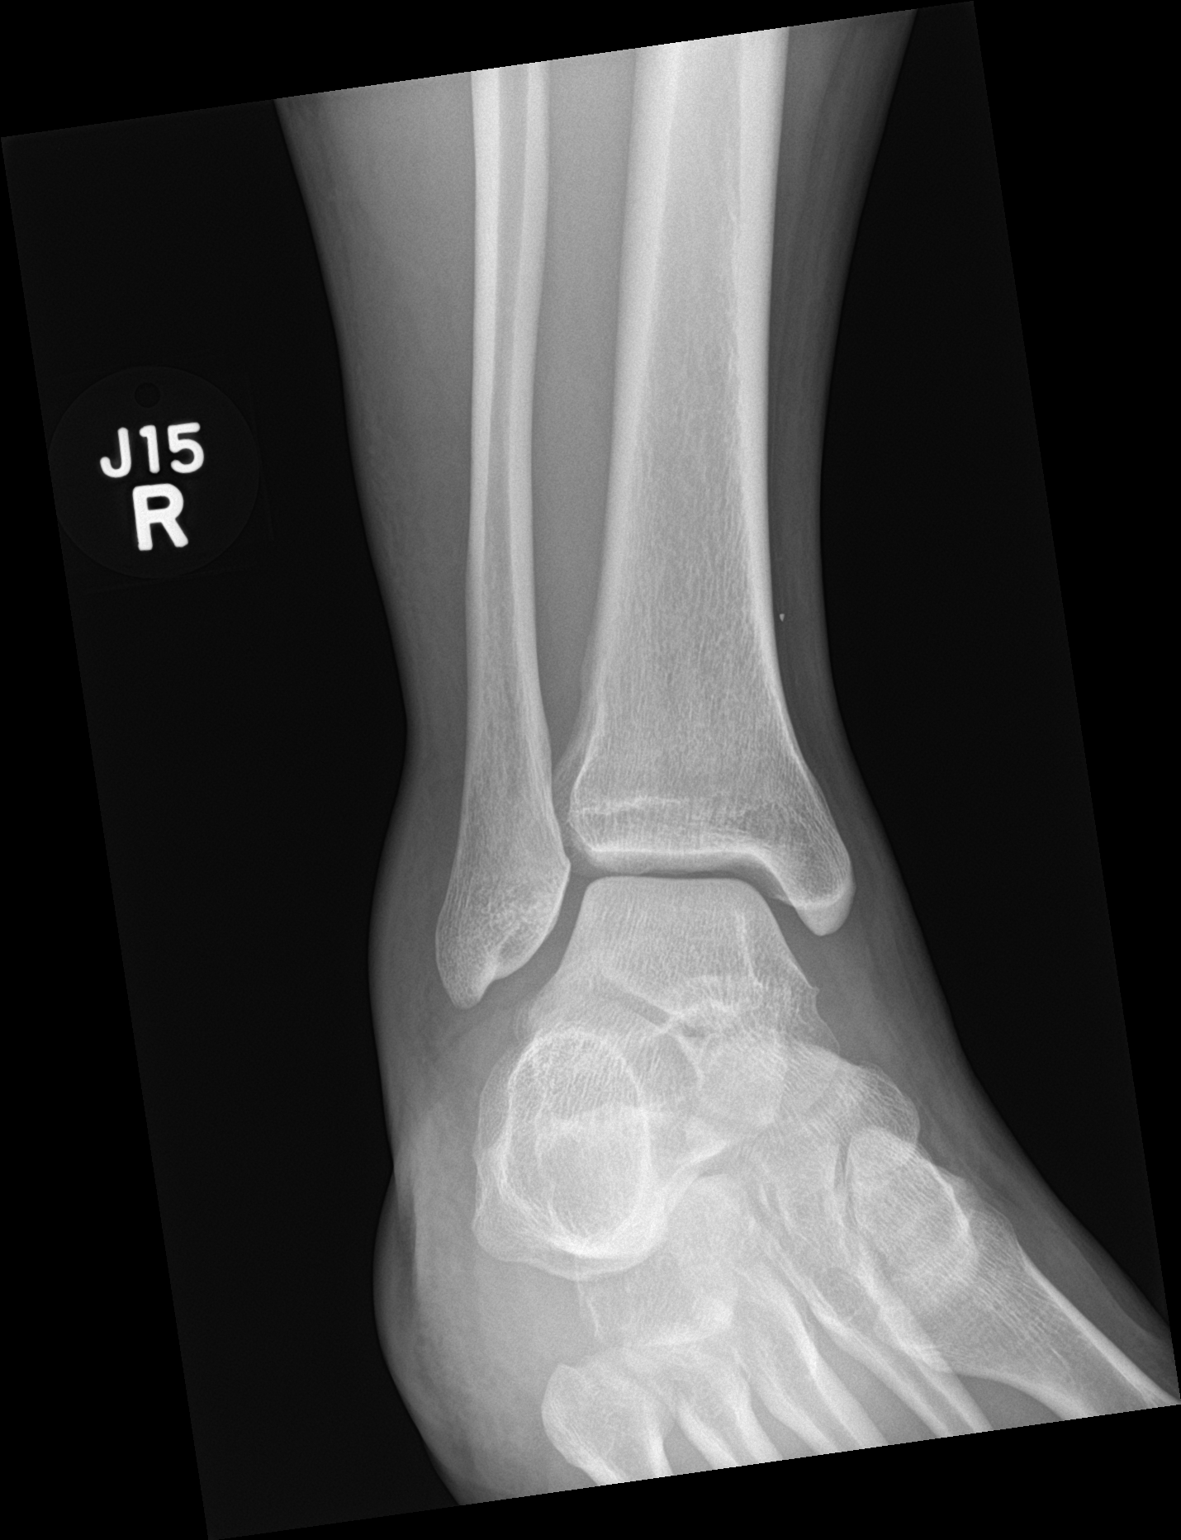

[ankle lat]
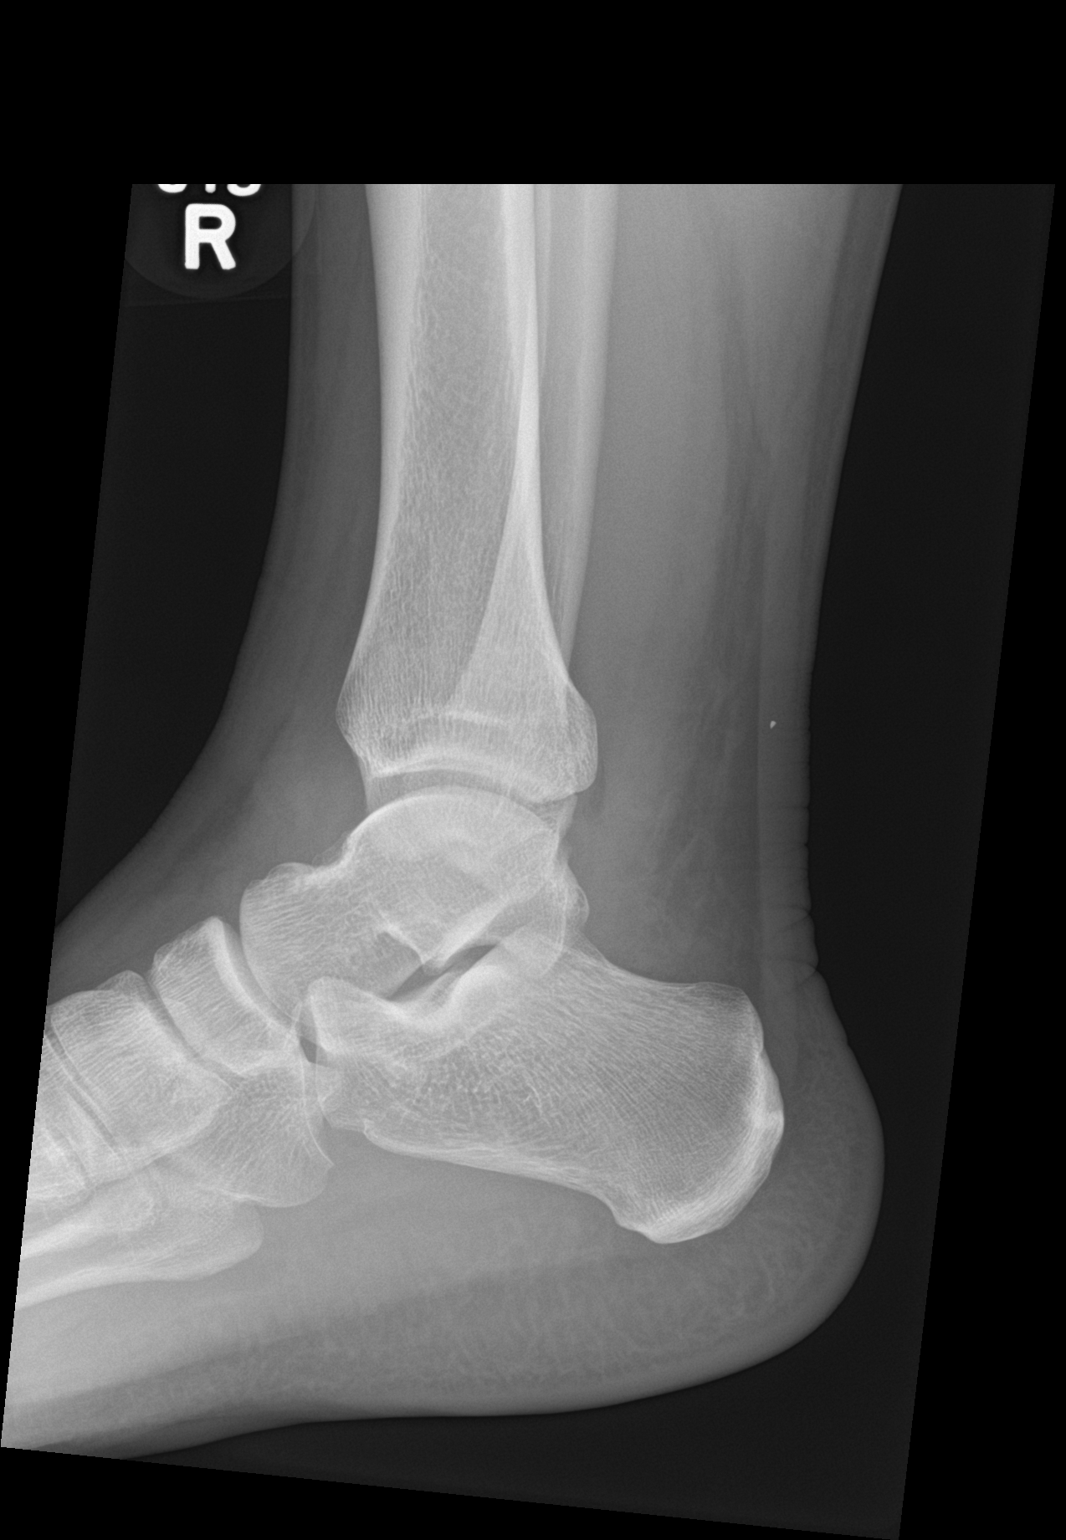

[3 of 3 positions shown; findings below may reference images not displayed]

FINDINGS: Soft tissue swelling is noted about the ankle. No acute fracture or
dislocation is seen.
IMPRESSION: Soft tissue swelling without acute bony abnormality.
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
